# Patient Record
Sex: Female | Born: 1950 | Race: White | Hispanic: No | Marital: Married | Smoking: Never smoker
Health system: Southern US, Community
[De-identification: ages and names within clinical notes are randomized; demographics above are authoritative.]

## PROBLEM LIST (undated history)

## (undated) DIAGNOSIS — K859 Acute pancreatitis without necrosis or infection, unspecified: Secondary | ICD-10-CM

## (undated) DIAGNOSIS — E119 Type 2 diabetes mellitus without complications: Secondary | ICD-10-CM

## (undated) DIAGNOSIS — L570 Actinic keratosis: Secondary | ICD-10-CM

## (undated) DIAGNOSIS — F419 Anxiety disorder, unspecified: Secondary | ICD-10-CM

## (undated) DIAGNOSIS — C4490 Unspecified malignant neoplasm of skin, unspecified: Secondary | ICD-10-CM

## (undated) DIAGNOSIS — R609 Edema, unspecified: Secondary | ICD-10-CM

## (undated) DIAGNOSIS — I10 Essential (primary) hypertension: Secondary | ICD-10-CM

## (undated) DIAGNOSIS — E785 Hyperlipidemia, unspecified: Secondary | ICD-10-CM

## (undated) DIAGNOSIS — L409 Psoriasis, unspecified: Secondary | ICD-10-CM

## (undated) HISTORY — DX: Edema, unspecified: R60.9

## (undated) HISTORY — DX: Psoriasis, unspecified: L40.9

## (undated) HISTORY — DX: Type 2 diabetes mellitus without complications: E11.9

## (undated) HISTORY — DX: Anxiety disorder, unspecified: F41.9

## (undated) HISTORY — DX: Acute pancreatitis without necrosis or infection, unspecified: K85.90

## (undated) HISTORY — PX: BREAST BIOPSY: SHX20

## (undated) HISTORY — DX: Unspecified malignant neoplasm of skin, unspecified: C44.90

## (undated) HISTORY — DX: Actinic keratosis: L57.0

## (undated) HISTORY — DX: Hyperlipidemia, unspecified: E78.5

## (undated) HISTORY — DX: Essential (primary) hypertension: I10

---

## 1988-03-29 HISTORY — PX: BREAST SURGERY: SHX581

## 2003-03-30 DIAGNOSIS — K859 Acute pancreatitis without necrosis or infection, unspecified: Secondary | ICD-10-CM

## 2003-03-30 HISTORY — PX: CHOLECYSTECTOMY: SHX55

## 2003-03-30 HISTORY — DX: Acute pancreatitis without necrosis or infection, unspecified: K85.90

## 2003-03-30 HISTORY — PX: VAGINAL HYSTERECTOMY: SUR661

## 2004-04-01 ENCOUNTER — Ambulatory Visit: Payer: Self-pay | Admitting: Nephrology

## 2004-05-04 ENCOUNTER — Ambulatory Visit: Payer: Self-pay | Admitting: General Surgery

## 2004-10-30 ENCOUNTER — Ambulatory Visit: Payer: Self-pay | Admitting: General Surgery

## 2005-03-29 HISTORY — PX: COLONOSCOPY: SHX174

## 2005-11-08 ENCOUNTER — Ambulatory Visit: Payer: Self-pay | Admitting: Unknown Physician Specialty

## 2005-11-11 ENCOUNTER — Ambulatory Visit: Payer: Self-pay | Admitting: General Surgery

## 2006-12-14 ENCOUNTER — Ambulatory Visit: Payer: Self-pay | Admitting: Unknown Physician Specialty

## 2007-09-06 DIAGNOSIS — D239 Other benign neoplasm of skin, unspecified: Secondary | ICD-10-CM

## 2007-09-06 HISTORY — DX: Other benign neoplasm of skin, unspecified: D23.9

## 2007-12-19 ENCOUNTER — Ambulatory Visit: Payer: Self-pay | Admitting: Unknown Physician Specialty

## 2008-12-19 ENCOUNTER — Ambulatory Visit: Payer: Self-pay | Admitting: Unknown Physician Specialty

## 2009-08-02 ENCOUNTER — Emergency Department: Payer: Self-pay | Admitting: Internal Medicine

## 2010-01-14 ENCOUNTER — Ambulatory Visit: Payer: Self-pay | Admitting: Unknown Physician Specialty

## 2011-01-27 ENCOUNTER — Ambulatory Visit: Payer: Self-pay | Admitting: Unknown Physician Specialty

## 2011-06-20 ENCOUNTER — Emergency Department: Payer: Self-pay | Admitting: Unknown Physician Specialty

## 2011-06-20 LAB — URINALYSIS, COMPLETE
Bacteria: NONE SEEN
Glucose,UR: 50 mg/dL (ref 0–75)
Hyaline Cast: 3
Ketone: NEGATIVE
Leukocyte Esterase: NEGATIVE
Ph: 6 (ref 4.5–8.0)
Protein: NEGATIVE
RBC,UR: NONE SEEN /HPF (ref 0–5)
Specific Gravity: 1.006 (ref 1.003–1.030)
Squamous Epithelial: 1
WBC UR: <1 /HPF (ref 0–5)

## 2011-06-20 LAB — CBC
HCT: 43.4 % (ref 35.0–47.0)
MCH: 31.3 pg (ref 26.0–34.0)
MCHC: 32.6 g/dL (ref 32.0–36.0)
MCV: 96 fL (ref 80–100)
Platelet: 284 10*3/uL (ref 150–440)
RBC: 4.52 10*6/uL (ref 3.80–5.20)
RDW: 12.8 % (ref 11.5–14.5)
WBC: 6.6 10*3/uL (ref 3.6–11.0)

## 2011-06-20 LAB — BASIC METABOLIC PANEL
Anion Gap: 14 (ref 7–16)
Calcium, Total: 9 mg/dL (ref 8.5–10.1)
EGFR (African American): 53 — ABNORMAL LOW
EGFR (Non-African Amer.): 44 — ABNORMAL LOW
Glucose: 209 mg/dL — ABNORMAL HIGH (ref 65–99)
Sodium: 140 mmol/L (ref 136–145)

## 2011-06-20 LAB — HEPATIC FUNCTION PANEL A (ARMC)
Alkaline Phosphatase: 93 U/L (ref 50–136)
Bilirubin, Direct: 0.2 mg/dL (ref 0.00–0.20)
Bilirubin,Total: 0.7 mg/dL (ref 0.2–1.0)
SGPT (ALT): 21 U/L
Total Protein: 8.3 g/dL — ABNORMAL HIGH (ref 6.4–8.2)

## 2011-06-20 LAB — TROPONIN I: Troponin-I: <0.02 ng/mL

## 2012-10-16 DIAGNOSIS — C4491 Basal cell carcinoma of skin, unspecified: Secondary | ICD-10-CM

## 2012-10-16 HISTORY — DX: Basal cell carcinoma of skin, unspecified: C44.91

## 2013-01-24 ENCOUNTER — Encounter: Payer: Self-pay | Admitting: Podiatry

## 2013-01-24 ENCOUNTER — Ambulatory Visit (INDEPENDENT_AMBULATORY_CARE_PROVIDER_SITE_OTHER): Payer: BC Managed Care – PPO | Admitting: Podiatry

## 2013-01-24 VITALS — BP 131/70 | HR 81 | Resp 16 | Ht 62.0 in | Wt 170.0 lb

## 2013-01-24 DIAGNOSIS — M722 Plantar fascial fibromatosis: Secondary | ICD-10-CM

## 2013-01-24 NOTE — Patient Instructions (Signed)
ICE INSTRUCTIONS  Apply ice or cold pack to the affected area at least 3 times a day for 10-15 minutes each time.  You should also use ice after prolonged activity or vigorous exercise.  Do not apply ice longer than 20 minutes at one time.  Always keep a cloth between your skin and the ice pack to prevent burns.  Being consistent and following these instructions will help control your symptoms.  We suggest you purchase a gel ice pack because they are reusable and do bit leak.  Some of them are designed to wrap around the area.  Use the method that works best for you.  Here are some other suggestions for icing.   Use a frozen bag of peas or corn-inexpensive and molds well to your body, usually stays frozen for 10 to 20 minutes.  Wet a towel with cold water and squeeze out the excess until it's damp.  Place in a bag in the freezer for 20 minutes. Then remove and use      Plantar Fasciitis Plantar fasciitis is a common condition that causes foot pain. It is soreness (inflammation) of the band of tough fibrous tissue on the bottom of the foot that runs from the heel bone (calcaneus) to the ball of the foot. The cause of this soreness may be from excessive standing, poor fitting shoes, running on hard surfaces, being overweight, having an abnormal walk, or overuse (this is common in runners) of the painful foot or feet. It is also common in aerobic exercise dancers and ballet dancers. SYMPTOMS  Most people with plantar fasciitis complain of:  Severe pain in the morning on the bottom of their foot especially when taking the first steps out of bed. This pain recedes after a few minutes of walking.  Severe pain is experienced also during walking following a long period of inactivity.  Pain is worse when walking barefoot or up stairs DIAGNOSIS   Your caregiver will diagnose this condition by examining and feeling your foot.  Special tests such as X-rays of your foot, are usually not  needed. PREVENTION   Consult a sports medicine professional before beginning a new exercise program.  Walking programs offer a good workout. With walking there is a lower chance of overuse injuries common to runners. There is less impact and less jarring of the joints.  Begin all new exercise programs slowly. If problems or pain develop, decrease the amount of time or distance until you are at a comfortable level.  Wear good shoes and replace them regularly.  Stretch your foot and the heel cords at the back of the ankle (Achilles tendon) both before and after exercise.  Run or exercise on even surfaces that are not hard. For example, asphalt is better than pavement.  Do not run barefoot on hard surfaces.  If using a treadmill, vary the incline.  Do not continue to workout if you have foot or joint problems. Seek professional help if they do not improve. HOME CARE INSTRUCTIONS   Avoid activities that cause you pain until you recover.  Use ice or cold packs on the problem or painful areas after working out.  Only take over-the-counter or prescription medicines for pain, discomfort, or fever as directed by your caregiver.  Soft shoe inserts or athletic shoes with air or gel sole cushions may be helpful.  If problems continue or become more severe, consult a sports medicine caregiver or your own health care provider. Cortisone is a potent anti-inflammatory medication  that may be injected into the painful area. You can discuss this treatment with your caregiver. MAKE SURE YOU:   Understand these instructions.  Will watch your condition.  Will get help right away if you are not doing well or get worse. Document Released: 12/08/2000 Document Revised: 06/07/2011 Document Reviewed: 02/07/2008 [employer] Patient Information 2014 [employer], [place]     Plantar Fasciitis (Heel Spur Syndrome) with Rehab The plantar fascia is a fibrous, ligament-like, soft-tissue structure that spans the  bottom of the foot. Plantar fasciitis is a condition that causes pain in the foot due to inflammation of the tissue. SYMPTOMS   Pain and tenderness on the underneath side of the foot.  Pain that worsens with standing or walking. CAUSES  Plantar fasciitis is caused by irritation and injury to the plantar fascia on the underneath side of the foot. Common mechanisms of injury include:  Direct trauma to bottom of the foot.  Damage to a small nerve that runs under the foot where the main fascia attaches to the heel bone.  Stress placed on the plantar fascia due to bone spurs. RISK INCREASES WITH:   Activities that place stress on the plantar fascia (running, jumping, pivoting, or cutting).  Poor strength and flexibility.  Improperly fitted shoes.  Tight calf muscles.  Flat feet.  Failure to warm-up properly before activity.  Obesity. PREVENTION  Warm up and stretch properly before activity.  Allow for adequate recovery between workouts.  Maintain physical fitness:  Strength, flexibility, and endurance.  Cardiovascular fitness.  Maintain a health body weight.  Avoid stress on the plantar fascia.  Wear properly fitted shoes, including arch supports for individuals who have flat feet. PROGNOSIS  If treated properly, then the symptoms of plantar fasciitis usually resolve without surgery. However, occasionally surgery is necessary. RELATED COMPLICATIONS   Recurrent symptoms that may result in a chronic condition.  Problems of the lower back that are caused by compensating for the injury, such as limping.  Pain or weakness of the foot during push-off following surgery.  Chronic inflammation, scarring, and partial or complete fascia tear, occurring more often from repeated injections. TREATMENT  Treatment initially involves the use of ice and medication to help reduce pain and inflammation. The use of strengthening and stretching exercises may help reduce pain with  activity, especially stretches of the Achilles tendon. These exercises may be performed at home or with a therapist. Your caregiver may recommend that you use heel cups of arch supports to help reduce stress on the plantar fascia. Occasionally, corticosteroid injections are given to reduce inflammation. If symptoms persist for greater than 6 months despite non-surgical (conservative), then surgery may be recommended.  MEDICATION   If pain medication is necessary, then nonsteroidal anti-inflammatory medications, such as aspirin and ibuprofen, or other minor pain relievers, such as acetaminophen, are often recommended.  Do not take pain medication within 7 days before surgery.  Prescription pain relievers may be given if deemed necessary by your caregiver. Use only as directed and only as much as you need.  Corticosteroid injections may be given by your caregiver. These injections should be reserved for the most serious cases, because they may only be given a certain number of times. HEAT AND COLD  Cold treatment (icing) relieves pain and reduces inflammation. Cold treatment should be applied for 10 to 15 minutes every 2 to 3 hours for inflammation and pain and immediately after any activity that aggravates your symptoms. Use ice packs or massage the area with a  piece of ice (ice massage).  Heat treatment may be used prior to performing the stretching and strengthening activities prescribed by your caregiver, physical therapist, or athletic trainer. Use a heat pack or soak the injury in warm water. SEEK IMMEDIATE MEDICAL CARE IF:  Treatment seems to offer no benefit, or the condition worsens.  Any medications produce adverse side effects. EXERCISES RANGE OF MOTION (ROM) AND STRETCHING EXERCISES - Plantar Fasciitis (Heel Spur Syndrome) These exercises may help you when beginning to rehabilitate your injury. Your symptoms may resolve with or without further involvement from your physician, physical  therapist or athletic trainer. While completing these exercises, remember:   Restoring tissue flexibility helps normal motion to return to the joints. This allows healthier, less painful movement and activity.  An effective stretch should be held for at least 30 seconds.  A stretch should never be painful. You should only feel a gentle lengthening or release in the stretched tissue. RANGE OF MOTION - Toe Extension, Flexion  Sit with your right / left leg crossed over your opposite knee.  Grasp your toes and gently pull them back toward the top of your foot. You should feel a stretch on the bottom of your toes and/or foot.  Hold this stretch for __________ seconds.  Now, gently pull your toes toward the bottom of your foot. You should feel a stretch on the top of your toes and or foot.  Hold this stretch for __________ seconds. Repeat __________ times. Complete this stretch __________ times per day.  RANGE OF MOTION - Ankle Dorsiflexion, Active Assisted  Remove shoes and sit on a chair that is preferably not on a carpeted surface.  Place right / left foot under knee. Extend your opposite leg for support.  Keeping your heel down, slide your right / left foot back toward the chair until you feel a stretch at your ankle or calf. If you do not feel a stretch, slide your bottom forward to the edge of the chair, while still keeping your heel down.  Hold this stretch for __________ seconds. Repeat __________ times. Complete this stretch __________ times per day.  STRETCH  Gastroc, Standing  Place hands on wall.  Extend right / left leg, keeping the front knee somewhat bent.  Slightly point your toes inward on your back foot.  Keeping your right / left heel on the floor and your knee straight, shift your weight toward the wall, not allowing your back to arch.  You should feel a gentle stretch in the right / left calf. Hold this position for __________ seconds. Repeat __________ times.  Complete this stretch __________ times per day. STRETCH  Soleus, Standing  Place hands on wall.  Extend right / left leg, keeping the other knee somewhat bent.  Slightly point your toes inward on your back foot.  Keep your right / left heel on the floor, bend your back knee, and slightly shift your weight over the back leg so that you feel a gentle stretch deep in your back calf.  Hold this position for __________ seconds. Repeat __________ times. Complete this stretch __________ times per day. STRETCH  Gastrocsoleus, Standing  Note: This exercise can place a lot of stress on your foot and ankle. Please complete this exercise only if specifically instructed by your caregiver.   Place the ball of your right / left foot on a step, keeping your other foot firmly on the same step.  Hold on to the wall or a rail for balance.  Slowly lift your other foot, allowing your body weight to press your heel down over the edge of the step.  You should feel a stretch in your right / left calf.  Hold this position for __________ seconds.  Repeat this exercise with a slight bend in your right / left knee. Repeat __________ times. Complete this stretch __________ times per day.  STRENGTHENING EXERCISES - Plantar Fasciitis (Heel Spur Syndrome)  These exercises may help you when beginning to rehabilitate your injury. They may resolve your symptoms with or without further involvement from your physician, physical therapist or athletic trainer. While completing these exercises, remember:   Muscles can gain both the endurance and the strength needed for everyday activities through controlled exercises.  Complete these exercises as instructed by your physician, physical therapist or athletic trainer. Progress the resistance and repetitions only as guided. STRENGTH - Towel Curls  Sit in a chair positioned on a non-carpeted surface.  Place your foot on a towel, keeping your heel on the floor.  Pull the  towel toward your heel by only curling your toes. Keep your heel on the floor.  If instructed by your physician, physical therapist or athletic trainer, add ____________________ at the end of the towel. Repeat __________ times. Complete this exercise __________ times per day. STRENGTH - Ankle Inversion  Secure one end of a rubber exercise band/tubing to a fixed object (table, pole). Loop the other end around your foot just before your toes.  Place your fists between your knees. This will focus your strengthening at your ankle.  Slowly, pull your big toe up and in, making sure the band/tubing is positioned to resist the entire motion.  Hold this position for __________ seconds.  Have your muscles resist the band/tubing as it slowly pulls your foot back to the starting position. Repeat __________ times. Complete this exercises __________ times per day.  Document Released: 03/15/2005 Document Revised: 06/07/2011 Document Reviewed: 06/27/2008 [facility] Patient Information 2014 [place], [place].

## 2013-01-24 NOTE — Progress Notes (Signed)
Valerie Farmer presents today for followup of her plantar fasciitis right states that he was doing okay until a went to the beach in a walk too much.  Objective: Vital signs are stable she is alert and oriented x3. Pulses remain palpable right lower extremity. Pain on palpation medial calcaneal tubercle right foot.  Assessment: Plantar fasciitis right.  Plan: Reinjected right heel today continue all conservative therapies followup with her in one month.

## 2013-02-26 ENCOUNTER — Encounter: Payer: Self-pay | Admitting: Podiatry

## 2013-02-26 ENCOUNTER — Ambulatory Visit (INDEPENDENT_AMBULATORY_CARE_PROVIDER_SITE_OTHER): Payer: BC Managed Care – PPO | Admitting: Podiatry

## 2013-02-26 VITALS — BP 144/74 | HR 70 | Resp 20 | Ht 62.0 in | Wt 170.0 lb

## 2013-02-26 DIAGNOSIS — M722 Plantar fascial fibromatosis: Secondary | ICD-10-CM

## 2013-02-26 NOTE — Patient Instructions (Signed)
Plantar Fasciitis (Heel Spur Syndrome) with Rehab The plantar fascia is a fibrous, ligament-like, soft-tissue structure that spans the bottom of the foot. Plantar fasciitis is a condition that causes pain in the foot due to inflammation of the tissue. SYMPTOMS   Pain and tenderness on the underneath side of the foot.  Pain that worsens with standing or walking. CAUSES  Plantar fasciitis is caused by irritation and injury to the plantar fascia on the underneath side of the foot. Common mechanisms of injury include:  Direct trauma to bottom of the foot.  Damage to a small nerve that runs under the foot where the main fascia attaches to the heel bone.  Stress placed on the plantar fascia due to bone spurs. RISK INCREASES WITH:   Activities that place stress on the plantar fascia (running, jumping, pivoting, or cutting).  Poor strength and flexibility.  Improperly fitted shoes.  Tight calf muscles.  Flat feet.  Failure to warm-up properly before activity.  Obesity. PREVENTION  Warm up and stretch properly before activity.  Allow for adequate recovery between workouts.  Maintain physical fitness:  Strength, flexibility, and endurance.  Cardiovascular fitness.  Maintain a health body weight.  Avoid stress on the plantar fascia.  Wear properly fitted shoes, including arch supports for individuals who have flat feet. PROGNOSIS  If treated properly, then the symptoms of plantar fasciitis usually resolve without surgery. However, occasionally surgery is necessary. RELATED COMPLICATIONS   Recurrent symptoms that may result in a chronic condition.  Problems of the lower back that are caused by compensating for the injury, such as limping.  Pain or weakness of the foot during push-off following surgery.  Chronic inflammation, scarring, and partial or complete fascia tear, occurring more often from repeated injections. TREATMENT  Treatment initially involves the use of  ice and medication to help reduce pain and inflammation. The use of strengthening and stretching exercises may help reduce pain with activity, especially stretches of the Achilles tendon. These exercises may be performed at home or with a therapist. Your caregiver may recommend that you use heel cups of arch supports to help reduce stress on the plantar fascia. Occasionally, corticosteroid injections are given to reduce inflammation. If symptoms persist for greater than 6 months despite non-surgical (conservative), then surgery may be recommended.  MEDICATION   If pain medication is necessary, then nonsteroidal anti-inflammatory medications, such as aspirin and ibuprofen, or other minor pain relievers, such as acetaminophen, are often recommended.  Do not take pain medication within 7 days before surgery.  Prescription pain relievers may be given if deemed necessary by your caregiver. Use only as directed and only as much as you need.  Corticosteroid injections may be given by your caregiver. These injections should be reserved for the most serious cases, because they may only be given a certain number of times. HEAT AND COLD  Cold treatment (icing) relieves pain and reduces inflammation. Cold treatment should be applied for 10 to 15 minutes every 2 to 3 hours for inflammation and pain and immediately after any activity that aggravates your symptoms. Use ice packs or massage the area with a piece of ice (ice massage).  Heat treatment may be used prior to performing the stretching and strengthening activities prescribed by your caregiver, physical therapist, or athletic trainer. Use a heat pack or soak the injury in warm water. SEEK IMMEDIATE MEDICAL CARE IF:  Treatment seems to offer no benefit, or the condition worsens.  Any medications produce adverse side effects. EXERCISES RANGE   OF MOTION (ROM) AND STRETCHING EXERCISES - Plantar Fasciitis (Heel Spur Syndrome) These exercises may help you  when beginning to rehabilitate your injury. Your symptoms may resolve with or without further involvement from your physician, physical therapist or athletic trainer. While completing these exercises, remember:   Restoring tissue flexibility helps normal motion to return to the joints. This allows healthier, less painful movement and activity.  An effective stretch should be held for at least 30 seconds.  A stretch should never be painful. You should only feel a gentle lengthening or release in the stretched tissue. RANGE OF MOTION - Toe Extension, Flexion  Sit with your right / left leg crossed over your opposite knee.  Grasp your toes and gently pull them back toward the top of your foot. You should feel a stretch on the bottom of your toes and/or foot.  Hold this stretch for __________ seconds.  Now, gently pull your toes toward the bottom of your foot. You should feel a stretch on the top of your toes and or foot.  Hold this stretch for __________ seconds. Repeat __________ times. Complete this stretch __________ times per day.  RANGE OF MOTION - Ankle Dorsiflexion, Active Assisted  Remove shoes and sit on a chair that is preferably not on a carpeted surface.  Place right / left foot under knee. Extend your opposite leg for support.  Keeping your heel down, slide your right / left foot back toward the chair until you feel a stretch at your ankle or calf. If you do not feel a stretch, slide your bottom forward to the edge of the chair, while still keeping your heel down.  Hold this stretch for __________ seconds. Repeat __________ times. Complete this stretch __________ times per day.  STRETCH  Gastroc, Standing  Place hands on wall.  Extend right / left leg, keeping the front knee somewhat bent.  Slightly point your toes inward on your back foot.  Keeping your right / left heel on the floor and your knee straight, shift your weight toward the wall, not allowing your back to  arch.  You should feel a gentle stretch in the right / left calf. Hold this position for __________ seconds. Repeat __________ times. Complete this stretch __________ times per day. STRETCH  Soleus, Standing  Place hands on wall.  Extend right / left leg, keeping the other knee somewhat bent.  Slightly point your toes inward on your back foot.  Keep your right / left heel on the floor, bend your back knee, and slightly shift your weight over the back leg so that you feel a gentle stretch deep in your back calf.  Hold this position for __________ seconds. Repeat __________ times. Complete this stretch __________ times per day. STRETCH  Gastrocsoleus, Standing  Note: This exercise can place a lot of stress on your foot and ankle. Please complete this exercise only if specifically instructed by your caregiver.   Place the ball of your right / left foot on a step, keeping your other foot firmly on the same step.  Hold on to the wall or a rail for balance.  Slowly lift your other foot, allowing your body weight to press your heel down over the edge of the step.  You should feel a stretch in your right / left calf.  Hold this position for __________ seconds.  Repeat this exercise with a slight bend in your right / left knee. Repeat __________ times. Complete this stretch __________ times per day.    STRENGTHENING EXERCISES - Plantar Fasciitis (Heel Spur Syndrome)  These exercises may help you when beginning to rehabilitate your injury. They may resolve your symptoms with or without further involvement from your physician, physical therapist or athletic trainer. While completing these exercises, remember:   Muscles can gain both the endurance and the strength needed for everyday activities through controlled exercises.  Complete these exercises as instructed by your physician, physical therapist or athletic trainer. Progress the resistance and repetitions only as guided. STRENGTH - Towel  Curls  Sit in a chair positioned on a non-carpeted surface.  Place your foot on a towel, keeping your heel on the floor.  Pull the towel toward your heel by only curling your toes. Keep your heel on the floor.  If instructed by your physician, physical therapist or athletic trainer, add ____________________ at the end of the towel. Repeat __________ times. Complete this exercise __________ times per day. STRENGTH - Ankle Inversion  Secure one end of a rubber exercise band/tubing to a fixed object (table, pole). Loop the other end around your foot just before your toes.  Place your fists between your knees. This will focus your strengthening at your ankle.  Slowly, pull your big toe up and in, making sure the band/tubing is positioned to resist the entire motion.  Hold this position for __________ seconds.  Have your muscles resist the band/tubing as it slowly pulls your foot back to the starting position. Repeat __________ times. Complete this exercises __________ times per day.  Document Released: 03/15/2005 Document Revised: 06/07/2011 Document Reviewed: 06/27/2008 [employer] Patient Information 2014 [employer]. Plantar Fasciitis Plantar fasciitis is a common condition that causes foot pain. It is soreness (inflammation) of the band of tough fibrous tissue on the bottom of the foot that runs from the heel bone (calcaneus) to the ball of the foot. The cause of this soreness may be from excessive standing, poor fitting shoes, running on hard surfaces, being overweight, having an abnormal walk, or overuse (this is common in runners) of the painful foot or feet. It is also common in aerobic exercise dancers and ballet dancers. SYMPTOMS  Most people with plantar fasciitis complain of:  Severe pain in the morning on the bottom of their foot especially when taking the first steps out of bed. This pain recedes after a few minutes of walking.  Severe pain is experienced also during walking  following a long period of inactivity.  Pain is worse when walking barefoot or up stairs DIAGNOSIS   Your caregiver will diagnose this condition by examining and feeling your foot.  Special tests such as X-rays of your foot, are usually not needed. PREVENTION   Consult a sports medicine professional before beginning a new exercise program.  Walking programs offer a good workout. With walking there is a lower chance of overuse injuries common to runners. There is less impact and less jarring of the joints.  Begin all new exercise programs slowly. If problems or pain develop, decrease the amount of time or distance until you are at a comfortable level.  Wear good shoes and replace them regularly.  Stretch your foot and the heel cords at the back of the ankle (Achilles tendon) both before and after exercise.  Run or exercise on even surfaces that are not hard. For example, asphalt is better than pavement.  Do not run barefoot on hard surfaces.  If using a treadmill, vary the incline.  Do not continue to workout if you have foot or joint   problems. Seek professional help if they do not improve. HOME CARE INSTRUCTIONS   Avoid activities that cause you pain until you recover.  Use ice or cold packs on the problem or painful areas after working out.  Only take over-the-counter or prescription medicines for pain, discomfort, or fever as directed by your caregiver.  Soft shoe inserts or athletic shoes with air or gel sole cushions may be helpful.  If problems continue or become more severe, consult a sports medicine caregiver or your own health care provider. Cortisone is a potent anti-inflammatory medication that may be injected into the painful area. You can discuss this treatment with your caregiver. MAKE SURE YOU:   Understand these instructions.  Will watch your condition.  Will get help right away if you are not doing well or get worse. Document Released: 12/08/2000 Document  Revised: 06/07/2011 Document Reviewed: 02/07/2008 [employer] Patient Information 2014 [employer].  

## 2013-02-26 NOTE — Progress Notes (Signed)
She presents today for followup of her plantar fasciitis to her right foot. She states that it's 75-80% better.  Objective: Pulses remain palpable right foot. She has pain on palpation medial continued tubercle right foot.  Assessment: Plantar fasciitis right  Plan: Reinjected right heel today apply plantar fascial strapping we'll followup with her one month. We'll consider orthotics at that time.

## 2013-03-26 ENCOUNTER — Ambulatory Visit: Payer: BC Managed Care – PPO | Admitting: Podiatry

## 2013-03-29 HISTORY — PX: BREAST CYST ASPIRATION: SHX578

## 2013-08-23 ENCOUNTER — Encounter: Payer: Self-pay | Admitting: *Deleted

## 2013-09-03 ENCOUNTER — Encounter: Payer: Self-pay | Admitting: General Surgery

## 2013-09-05 ENCOUNTER — Ambulatory Visit: Payer: BC Managed Care – PPO | Admitting: General Surgery

## 2013-09-05 ENCOUNTER — Encounter: Payer: Self-pay | Admitting: General Surgery

## 2013-09-05 ENCOUNTER — Ambulatory Visit (INDEPENDENT_AMBULATORY_CARE_PROVIDER_SITE_OTHER): Payer: Medicare Other | Admitting: General Surgery

## 2013-09-05 ENCOUNTER — Other Ambulatory Visit: Payer: Medicare Other

## 2013-09-05 ENCOUNTER — Other Ambulatory Visit: Payer: Self-pay

## 2013-09-05 VITALS — BP 122/82 | HR 80 | Resp 16 | Ht 62.0 in | Wt 175.0 lb

## 2013-09-05 DIAGNOSIS — N63 Unspecified lump in unspecified breast: Secondary | ICD-10-CM

## 2013-09-05 HISTORY — PX: BREAST SURGERY: SHX581

## 2013-09-05 NOTE — Progress Notes (Addendum)
Patient ID: Valerie Farmer, female   DOB: [birth year], 63 y.o.   MRN: [id number]  Chief Complaint  Patient presents with  . Other    left breast pain    HPI Valerie Farmer is a 63 y.o. female who presents for a breast evaluation. The most recent mammogram was done on 04/13/2013. Patient states she felt a left breast knot below her nipple has  been for about 2 weeks ago .She states the knot has got smaller but she still is tender there. Patient does not perform regular self breast checks but does gets regular mammograms done.    The patient has significantly improved since the onset and the redness which was described as being in a "V" as stated.  There was no history of trauma to the area.  HPI  Past Medical History  Diagnosis Date  . Swelling   . Diabetes   . HBP (high blood pressure)   . Skin cancer   . Pancreatitis 2005    Past Surgical History  Procedure Laterality Date  . Gallbladder surgery    . Cholecystectomy  2005  . Vaginal hysterectomy  2005    Family History  Problem Relation Age of Onset  . Bladder Cancer Father   . Breast cancer Paternal Aunt     Social History History  Substance Use Topics  . Smoking status: Never Smoker   . Smokeless tobacco: Never Used  . Alcohol Use: No    No Known Allergies  Current Outpatient Prescriptions  Medication Sig Dispense Refill  . ALPRAZolam (XANAX) 0.25 MG tablet       . aspirin 81 MG tablet Take 81 mg by mouth daily.      Marland Kitchen atorvastatin (LIPITOR) 20 MG tablet       . Calcium Carb-Cholecalciferol (CALCIUM 600 + D PO) Take by mouth daily.      . Cholecalciferol (VITAMIN D-3 PO) Take by mouth daily.      Marland Kitchen losartan (COZAAR) 50 MG tablet       . losartan-hydrochlorothiazide (HYZAAR) 50-12.5 MG per tablet       . metformin (FORTAMET) 1000 MG (OSM) 24 hr tablet       . NASONEX 50 MCG/ACT nasal spray       . ONE TOUCH ULTRA TEST test strip       . Probiotic Product (ALIGN PO) Take by mouth daily.       No current  facility-administered medications for this visit.    Review of Systems Review of Systems  Constitutional: Negative.   Respiratory: Negative.   Cardiovascular: Negative.     Blood pressure 122/82, pulse 80, resp. rate 16, height 5\' 2"  (1.575 m), weight 175 lb (79.379 kg).  Physical Exam Physical Exam  Constitutional: She is oriented to person, place, and time. She appears well-developed and well-nourished.  Eyes: Conjunctivae are normal. No scleral icterus.  Neck: Neck supple.  Cardiovascular: Normal rate, regular rhythm and normal heart sounds.   Pulmonary/Chest: Breath sounds normal. Right breast exhibits no inverted nipple, no mass, no nipple discharge, no skin change and no tenderness. Left breast exhibits no inverted nipple, no mass, no nipple discharge, no skin change and no tenderness. Breasts are asymmetrical (left breast bigger than right breast).    Abdominal: Soft. Bowel sounds are normal. There is no tenderness.  Neurological: She is alert and oriented to person, place, and time.  Skin: Skin is warm and dry.    Data Reviewed Bilateral mammograms dated 04/13/2013 were reported as  BI-RAD-2, scattered fibroglandular density.  The supplied the disc could not be read.  Ultrasound examination of the left breast was completed with special attention to the area of palpable thickening. A 0.7 x 1.4 x 2.17 cm hypoechoic mass with faint enhancement and scattered areas of shadowing beginning below the base of the nipple and extending to the edge of the areola. There was a patent vessel running below the index lesion towards the nipple measuring perhaps 2.5 mm in diameter.  The appearance is that of a traumatic lesion, possibly now with residual hematoma. The patient was amenable to FNA sampling. 1 cc of 1% plain Xylocaine was utilized and multiple passes through the area were completed with a small return of fluid. Slides were prepared for cytology. The procedure was well  tolerated.  The 04/13/2013 screening mammograms completed at [place] were reviewed. No change in the long-standing density in the upper-outer quadrant right breast. The left breast is essentially fatty replaced. Fatty-replaced lymph nodes in the left axilla. No retroareolar abnormality on the left. No skin thickening.    Assessment    Left breast mass with changes suggesting vascular injury.    Plan    The patient will be contacted when cytology is available. If entirely benign she's been asked to return for a six-week checked to be sure this area completely resolves. She has reluctantly agreed.    PCP: Marcello Fennel  Ref. Dr. Percell Boston, [place] Gleason 09/07/2013, 2:23 PM

## 2013-09-05 NOTE — Patient Instructions (Addendum)
Patient to return in six weeks.  

## 2013-09-07 DIAGNOSIS — N63 Unspecified lump in unspecified breast: Secondary | ICD-10-CM | POA: Insufficient documentation

## 2013-09-11 ENCOUNTER — Telehealth: Payer: Self-pay | Admitting: General Surgery

## 2013-09-11 LAB — FINE-NEEDLE ASPIRATION

## 2013-09-11 NOTE — Telephone Encounter (Signed)
Cytology showed evidence of acute inflammation and blood. This corresponds with the clinical history provided by the patient. She was notified of the results. We'll plan on a followup exam in 6 weeks as originally discussed.

## 2013-10-08 ENCOUNTER — Ambulatory Visit (INDEPENDENT_AMBULATORY_CARE_PROVIDER_SITE_OTHER): Payer: Medicare Other | Admitting: General Surgery

## 2013-10-08 ENCOUNTER — Encounter: Payer: Self-pay | Admitting: General Surgery

## 2013-10-08 VITALS — BP 132/64 | HR 72 | Resp 12 | Ht 62.0 in | Wt 174.0 lb

## 2013-10-08 DIAGNOSIS — N63 Unspecified lump in unspecified breast: Secondary | ICD-10-CM

## 2013-10-08 NOTE — Patient Instructions (Signed)
Colonoscopy 2017.

## 2013-10-08 NOTE — Progress Notes (Signed)
Patient ID: Valerie Farmer, female   DOB: 11-11-1950, 63 y.o.   MRN: [id number]  Chief Complaint  Patient presents with  . Follow-up    left breast mass    HPI Valerie Farmer is a 63 y.o. female here today for her six weeks left breast mass aspirated her last visit. Patient states she does not feel the area any more.     HPI  Past Medical History  Diagnosis Date  . Swelling   . Diabetes   . HBP (high blood pressure)   . Skin cancer   . Pancreatitis 2005    Past Surgical History  Procedure Laterality Date  . Gallbladder surgery    . Cholecystectomy  2005  . Vaginal hysterectomy  2005  . Colonoscopy  2007    DR. Keyion Knack    Family History  Problem Relation Age of Onset  . Bladder Cancer Father   . Breast cancer Paternal Aunt     Social History History  Substance Use Topics  . Smoking status: Never Smoker   . Smokeless tobacco: Never Used  . Alcohol Use: No    No Known Allergies  Current Outpatient Prescriptions  Medication Sig Dispense Refill  . ALPRAZolam (XANAX) 0.25 MG tablet Take 0.25 mg by mouth at bedtime as needed.       Marland Kitchen aspirin 81 MG tablet Take 81 mg by mouth daily.      Marland Kitchen atorvastatin (LIPITOR) 20 MG tablet Take 20 mg by mouth daily at 6 PM.       . Calcium Carb-Cholecalciferol (CALCIUM 600 + D PO) Take by mouth daily.      . Cholecalciferol (VITAMIN D-3 PO) Take by mouth daily.      Marland Kitchen losartan (COZAAR) 50 MG tablet Take 50 mg by mouth daily.       Marland Kitchen losartan-hydrochlorothiazide (HYZAAR) 50-12.5 MG per tablet Take 1 tablet by mouth daily.       . metformin (FORTAMET) 1000 MG (OSM) 24 hr tablet Take 1,000 mg by mouth daily with breakfast.       . NASONEX 50 MCG/ACT nasal spray Place 2 sprays into the nose daily.       . ONE TOUCH ULTRA TEST test strip       . Probiotic Product (ALIGN PO) Take 1 tablet by mouth daily.        No current facility-administered medications for this visit.    Review of Systems Review of Systems  Constitutional:  Negative.   Respiratory: Negative.   Cardiovascular: Negative.   Gastrointestinal:       The patient reports she is noticed increasing amounts of flatus and belching. No change in bowel habits. No blood or mucus in her stools.    Blood pressure 132/64, pulse 72, resp. rate 12, height 5\' 2"  (1.575 m), weight 174 lb (78.926 kg).  Physical Exam Physical Exam  Constitutional: She is oriented to person, place, and time. She appears well-developed and well-nourished.  Eyes: Conjunctivae are normal. No scleral icterus.  Neck: Neck supple.  Cardiovascular: Normal rate, regular rhythm and normal heart sounds.   Pulmonary/Chest: Effort normal and breath sounds normal. Left breast exhibits no inverted nipple, no mass, no nipple discharge, no skin change and no tenderness.  Complete resolution of nodular density at the edge of the areola to 5:00 position and erythematous streaking noted on her examination 6 weeks ago.  Abdominal: Soft. Bowel sounds are normal. There is no tenderness.  Lymphadenopathy:    She has  no cervical adenopathy.    She has no axillary adenopathy.  Neurological: She is alert and oriented to person, place, and time.  Skin: Skin is warm and dry.    Data Reviewed 09/05/2013 FNA: LEFT BREAST NEGATIVE FOR MALIGNANT CELLS. FOAM CELLS ARE PRESENT IN A BACKGROUND OF ACUTE INFLAMMATION AND BLOOD.   Assessment    Complete resolution of previous breast swelling and redness. Likely traumatic episode. Increased flatus, improved with the use of probiotic.    Plan    The patient was asked to call should she have recurrent symptoms. Otherwise she'll continue annual screening exams with her primary care provider/GYN. The patient remains a candidate for a colonoscopy in 2017. Unless blood or mucus is evident in the stools or she appreciates is change in bowel habits, early exam was not indicated.     PCP: Marcello Fennel Ref. Dr. Percell Boston, [place] Gleason 10/09/2013,  5:17 PM

## 2013-10-09 ENCOUNTER — Encounter: Payer: Self-pay | Admitting: General Surgery

## 2013-11-27 DIAGNOSIS — I10 Essential (primary) hypertension: Secondary | ICD-10-CM | POA: Insufficient documentation

## 2013-11-27 DIAGNOSIS — E7800 Pure hypercholesterolemia, unspecified: Secondary | ICD-10-CM | POA: Insufficient documentation

## 2013-11-27 DIAGNOSIS — E119 Type 2 diabetes mellitus without complications: Secondary | ICD-10-CM | POA: Insufficient documentation

## 2013-11-27 DIAGNOSIS — F419 Anxiety disorder, unspecified: Secondary | ICD-10-CM | POA: Insufficient documentation

## 2014-01-28 ENCOUNTER — Encounter: Payer: Self-pay | Admitting: General Surgery

## 2014-05-20 DIAGNOSIS — D039 Melanoma in situ, unspecified: Secondary | ICD-10-CM

## 2014-05-20 HISTORY — DX: Melanoma in situ, unspecified: D03.9

## 2014-07-08 DIAGNOSIS — Z86006 Personal history of melanoma in-situ: Secondary | ICD-10-CM | POA: Insufficient documentation

## 2015-02-12 ENCOUNTER — Ambulatory Visit (INDEPENDENT_AMBULATORY_CARE_PROVIDER_SITE_OTHER): Payer: Medicare Other | Admitting: Podiatry

## 2015-02-12 ENCOUNTER — Encounter: Payer: Self-pay | Admitting: Podiatry

## 2015-02-12 VITALS — BP 121/61 | HR 86 | Resp 16

## 2015-02-12 DIAGNOSIS — Q828 Other specified congenital malformations of skin: Secondary | ICD-10-CM

## 2015-02-12 DIAGNOSIS — M79676 Pain in unspecified toe(s): Secondary | ICD-10-CM

## 2015-02-12 DIAGNOSIS — B351 Tinea unguium: Secondary | ICD-10-CM | POA: Diagnosis not present

## 2015-02-12 DIAGNOSIS — L600 Ingrowing nail: Secondary | ICD-10-CM

## 2015-02-12 MED ORDER — NEOMYCIN-POLYMYXIN-HC 1 % OT SOLN: OTIC | Status: DC

## 2015-02-12 NOTE — Progress Notes (Signed)
She is presents today with chief complaint of painful tyloma bilateral. She's also complaining of painful elongated toenails with 1 toenail in particular her second right growing into her skin. She states that this is almost impossible to trim and she has a hard time getting to it. She denies changes in her past medical history medications allergies surgeries and social history. States that she spends a lot of time taking care of both her mother and mother-in-law as they are in a nursing facility.  Objective: Vital signs are stable alert and oriented 3. Pulses are strongly palpable. Neurologic sensorium is intact. Her toenails are thick and dystrophic painful palpation. She is a thick tyloma plantar aspect of bilateral foot left greater than right but is not thick enough to trim today. She also has a sharply incurvated nail margin along the tibial border of the second digit of the right foot. This is consistent with an ingrown toenail.  Assessment: Ingrown toenail. Painful elongated toenails bilateral. Tyloma bilateral.  Plan: Discussed etiology pathology conservative versus surgical therapies. At this point I encouraged her to have the matrixectomy performed. This is performed today after local anesthesia was administered resecting the medial border of the incurvated nail plate of the second toe right foot. This was removed in total today and phenolized. She was given both oral and written home-going instructions as well as a prescription for Cortisporin Otic and will follow up with her in a couple of weeks. I also debrided her nails for her today 1 through 5 bilateral.

## 2015-02-12 NOTE — Patient Instructions (Signed)

## 2015-03-03 ENCOUNTER — Ambulatory Visit (INDEPENDENT_AMBULATORY_CARE_PROVIDER_SITE_OTHER): Payer: Medicare Other | Admitting: Podiatry

## 2015-03-03 ENCOUNTER — Encounter: Payer: Self-pay | Admitting: Podiatry

## 2015-03-03 DIAGNOSIS — L600 Ingrowing nail: Secondary | ICD-10-CM

## 2015-03-03 NOTE — Progress Notes (Signed)
She presents today for follow-up second digit tibial border right foot status post matrixectomy 2 weeks ago. She states that she stop soaking and is no longer using the medication. She says the toe feels good.  Objective: Vital signs are stable she is alert and oriented 3. No erythema edema cellulitis drainage or odor. Second digit right foot appears to be healing well.  Assessment: Healing second digit right foot status post matrixectomy.  Plan: Encouraged her to soak it for a while longer Epsom salts and warm water covered in the daily living at night. Follow up with me should this become more red or painful.

## 2015-03-03 NOTE — Patient Instructions (Signed)

## 2015-07-07 ENCOUNTER — Encounter: Payer: Self-pay | Admitting: *Deleted

## 2015-09-22 ENCOUNTER — Encounter: Payer: Self-pay | Admitting: *Deleted

## 2015-10-01 ENCOUNTER — Ambulatory Visit (INDEPENDENT_AMBULATORY_CARE_PROVIDER_SITE_OTHER): Payer: Medicare Other | Admitting: General Surgery

## 2015-10-01 ENCOUNTER — Encounter: Payer: Self-pay | Admitting: General Surgery

## 2015-10-01 VITALS — BP 136/84 | HR 78 | Resp 13 | Ht 62.0 in | Wt 180.0 lb

## 2015-10-01 DIAGNOSIS — Z1211 Encounter for screening for malignant neoplasm of colon: Secondary | ICD-10-CM

## 2015-10-01 MED ORDER — POLYETHYLENE GLYCOL 3350 17 GM/SCOOP PO POWD: ORAL | Status: DC

## 2015-10-01 NOTE — Patient Instructions (Signed)
Colonoscopy A colonoscopy is an exam to look at the entire large intestine (colon). This exam can help find problems such as tumors, polyps, inflammation, and areas of bleeding. The exam takes about 1 hour.  LET YOUR HEALTH CARE PROVIDER KNOW ABOUT:   Any allergies you have.  All medicines you are taking, including vitamins, herbs, eye drops, creams, and over-the-counter medicines.  Previous problems you or members of your family have had with the use of anesthetics.  Any blood disorders you have.  Previous surgeries you have had.  Medical conditions you have. RISKS AND COMPLICATIONS  Generally, this is a safe procedure. However, as with any procedure, complications can occur. Possible complications include:  Bleeding.  Tearing or rupture of the colon wall.  Reaction to medicines given during the exam.  Infection (rare). BEFORE THE PROCEDURE   Ask your health care provider about changing or stopping your regular medicines.  You may be prescribed an oral bowel prep. This involves drinking a large amount of medicated liquid, starting the day before your procedure. The liquid will cause you to have multiple loose stools until your stool is almost clear or light green. This cleans out your colon in preparation for the procedure.  Do not eat or drink anything else once you have started the bowel prep, unless your health care provider tells you it is safe to do so.  Arrange for someone to drive you home after the procedure. PROCEDURE   You will be given medicine to help you relax (sedative).  You will lie on your side with your knees bent.  A long, flexible tube with a light and camera on the end (colonoscope) will be inserted through the rectum and into the colon. The camera sends video back to a computer screen as it moves through the colon. The colonoscope also releases carbon dioxide gas to inflate the colon. This helps your health care provider see the area better.  During  the exam, your health care provider may take a small tissue sample (biopsy) to be examined under a microscope if any abnormalities are found.  The exam is finished when the entire colon has been viewed. AFTER THE PROCEDURE   Do not drive for 24 hours after the exam.  You may have a small amount of blood in your stool.  You may pass moderate amounts of gas and have mild abdominal cramping or bloating. This is caused by the gas used to inflate your colon during the exam.  Ask when your test results will be ready and how you will get your results. Make sure you get your test results.   This information is not intended to replace advice given to you by your health care provider. Make sure you discuss any questions you have with your health care provider.   Document Released: 03/12/2000 Document Revised: 01/03/2013 Document Reviewed: 11/20/2012 Elsevier Interactive Patient Education 2016 Elsevier Inc.  

## 2015-10-01 NOTE — Progress Notes (Signed)
Patient ID: Valerie Farmer, female   DOB: [birth year], 65 y.o.   MRN: [id number]  Chief Complaint  Patient presents with  . Colonoscopy    HPI Valerie Farmer is a 65 y.o. female.  Who presents for a colonoscopy discussion. The last colonoscopy was completed in 2007. Denies any gastrointestinal issues. She started a Probiotic supplement about a year ago due to burping and gas, this has helped. Bowels move regular and no bleeding noted. She states that she is still having some discomfort in the left breast.  I personally reviewed the patient's history. HPI  Past Medical History  Diagnosis Date  . Swelling   . Diabetes ([place])   . HBP (high blood pressure)   . Skin cancer   . Pancreatitis 2005    Past Surgical History  Procedure Laterality Date  . Cholecystectomy  2005  . Vaginal hysterectomy  2005  . Colonoscopy  2007    DR. Fannye Myer, normal study  . Breast surgery Left 09/05/2013    FNA: Comments: LEFT BREAST  . Breast surgery Right 1990    Family History  Problem Relation Age of Onset  . Bladder Cancer Father   . Breast cancer Paternal Aunt     Social History Social History  Substance Use Topics  . Smoking status: Never Smoker   . Smokeless tobacco: Never Used  . Alcohol Use: No    No Known Allergies  Current Outpatient Prescriptions  Medication Sig Dispense Refill  . aspirin EC 81 MG tablet Take by mouth.    Marland Kitchen atorvastatin (LIPITOR) 20 MG tablet Take by mouth.    . Calcium Carb-Cholecalciferol (CALCIUM 600 + D PO) Take by mouth daily.    . Cholecalciferol (VITAMIN D-3 PO) Take by mouth daily.    Marland Kitchen escitalopram (LEXAPRO) 10 MG tablet Take 10 mg by mouth. Take 1 1/2 tabs daily    . glipiZIDE (GLUCOTROL) 10 MG tablet Take 5 mg by mouth.     . losartan-hydrochlorothiazide (HYZAAR) 50-12.5 MG tablet Take by mouth.    . metformin (FORTAMET) 1000 MG (OSM) 24 hr tablet   0  . mometasone (NASONEX) 50 MCG/ACT nasal spray Place into the nose as needed.     . ONE TOUCH  ULTRA TEST test strip     . Probiotic Product (ALIGN PO) Take 1 tablet by mouth daily.     Marland Kitchen ALPRAZolam (XANAX) 0.25 MG tablet Take by mouth at bedtime as needed. Reported on 10/01/2015    . polyethylene glycol powder (GLYCOLAX/MIRALAX) powder 255 grams one bottle for colonoscopy prep 255 g 0   No current facility-administered medications for this visit.    Review of Systems Review of Systems  Constitutional: Negative.   Respiratory: Negative.   Cardiovascular: Negative.   Gastrointestinal: Negative.     Blood pressure 136/84, pulse 78, resp. rate 13, height 5\' 2"  (1.575 m), weight 180 lb (81.647 kg).  Physical Exam Physical Exam  Constitutional: She is oriented to person, place, and time. She appears well-developed and well-nourished.  Eyes: Conjunctivae are normal. No scleral icterus.  Neck: Neck supple.  Cardiovascular: Normal rate, regular rhythm and normal heart sounds.   Pulmonary/Chest: Effort normal and breath sounds normal. Right breast exhibits no inverted nipple, no mass, no nipple discharge, no skin change and no tenderness. Left breast exhibits no inverted nipple, no mass, no nipple discharge, no skin change and no tenderness. Breasts are asymmetrical (left breast larger than right).    Abdominal: Soft. Normal appearance and bowel  sounds are normal.  Lymphadenopathy:    She has no cervical adenopathy.  Neurological: She is alert and oriented to person, place, and time.  Skin: Skin is warm and dry.  Psychiatric: She has a normal mood and affect.    Data Reviewed 06/30/2015 laboratory studies reviewed: CBC unremarkable. Comprehensive metabolic panel notable for a creatinine of 1.2 with an estimated GFR 45. IHemoglobin A1c: 6.9.  Assessment    Candidate for screening colonoscopy.    Plan        Colonoscopy with possible biopsy/polypectomy prn: Information regarding the procedure, including its potential risks and complications (including but not limited to  perforation of the bowel, which may require emergency surgery to repair, and bleeding) was verbally given to the patient. Educational information regarding lower intestinal endoscopy was given to the patient. Written instructions for how to complete the bowel prep using Miralax were provided. The importance of drinking ample fluids to avoid dehydration as a result of the prep emphasized.  Patient has been scheduled for a colonoscopy on 11-10-15 at [facility]. This patient has been asked to hold metformin day of colonoscopy prep and procedure. It is okay for patient to continue glipizide day of colonoscopy prep. Also, okay for patient to continue 81 mg aspirin once daily.    PCP:  Derinda Late This has been scribed by Lesly Rubenstein LPN   Robert Bellow 10/01/2015, 8:53 PM

## 2015-11-06 ENCOUNTER — Encounter: Payer: Self-pay | Admitting: *Deleted

## 2015-11-10 ENCOUNTER — Ambulatory Visit
Admission: RE | Admit: 2015-11-10 | Discharge: 2015-11-10 | Disposition: A | Discharge status: Home / Self Care | Payer: Medicare Other | Source: Ambulatory Visit | Attending: General Surgery | Admitting: General Surgery

## 2015-11-10 ENCOUNTER — Encounter
Admission: RE | Disposition: A | Discharge status: Home / Self Care | Payer: Self-pay | Source: Ambulatory Visit | Attending: General Surgery

## 2015-11-10 ENCOUNTER — Ambulatory Visit: Payer: Medicare Other | Admitting: Anesthesiology

## 2015-11-10 ENCOUNTER — Encounter: Payer: Self-pay | Admitting: *Deleted

## 2015-11-10 DIAGNOSIS — K5730 Diverticulosis of large intestine without perforation or abscess without bleeding: Secondary | ICD-10-CM | POA: Diagnosis not present

## 2015-11-10 DIAGNOSIS — Z7984 Long term (current) use of oral hypoglycemic drugs: Secondary | ICD-10-CM | POA: Diagnosis not present

## 2015-11-10 DIAGNOSIS — Z8719 Personal history of other diseases of the digestive system: Secondary | ICD-10-CM | POA: Diagnosis not present

## 2015-11-10 DIAGNOSIS — Z7982 Long term (current) use of aspirin: Secondary | ICD-10-CM | POA: Insufficient documentation

## 2015-11-10 DIAGNOSIS — I10 Essential (primary) hypertension: Secondary | ICD-10-CM | POA: Insufficient documentation

## 2015-11-10 DIAGNOSIS — Z79899 Other long term (current) drug therapy: Secondary | ICD-10-CM | POA: Diagnosis not present

## 2015-11-10 DIAGNOSIS — Z85828 Personal history of other malignant neoplasm of skin: Secondary | ICD-10-CM | POA: Insufficient documentation

## 2015-11-10 DIAGNOSIS — E119 Type 2 diabetes mellitus without complications: Secondary | ICD-10-CM | POA: Insufficient documentation

## 2015-11-10 DIAGNOSIS — Z1211 Encounter for screening for malignant neoplasm of colon: Secondary | ICD-10-CM | POA: Diagnosis not present

## 2015-11-10 HISTORY — PX: COLONOSCOPY WITH PROPOFOL: SHX5780

## 2015-11-10 HISTORY — DX: Essential (primary) hypertension: I10

## 2015-11-10 LAB — GLUCOSE, CAPILLARY: GLUCOSE-CAPILLARY: 169 mg/dL — AB (ref 65–99)

## 2015-11-10 SURGERY — COLONOSCOPY WITH PROPOFOL
Anesthesia: General

## 2015-11-10 MED ORDER — PROPOFOL 500 MG/50ML IV EMUL
INTRAVENOUS | Status: DC | PRN
  Administered 2015-11-10: 100 ug/kg/min via INTRAVENOUS

## 2015-11-10 MED ORDER — SODIUM CHLORIDE 0.9 % IV SOLN
INTRAVENOUS | Status: DC
  Administered 2015-11-10: 1000 mL via INTRAVENOUS

## 2015-11-10 MED ORDER — FENTANYL CITRATE (PF) 100 MCG/2ML IJ SOLN
INTRAMUSCULAR | Status: DC | PRN
Start: 2015-11-10 — End: 2015-11-10
  Administered 2015-11-10: 50 ug via INTRAVENOUS

## 2015-11-10 MED ORDER — FENTANYL CITRATE (PF) 100 MCG/2ML IJ SOLN: 25.0000 ug | INTRAMUSCULAR | Status: DC | PRN

## 2015-11-10 MED ORDER — MIDAZOLAM HCL 2 MG/2ML IJ SOLN
INTRAMUSCULAR | Status: DC | PRN
  Administered 2015-11-10: 1 mg via INTRAVENOUS

## 2015-11-10 MED ORDER — ONDANSETRON HCL 4 MG/2ML IJ SOLN: 4.0000 mg | Freq: Once | INTRAMUSCULAR | Status: DC | PRN

## 2015-11-10 NOTE — Op Note (Signed)
[facility]astroenter[facility] Patient Name: Valerie Farmer Procedure Date: 11/10/2015 8:08 AM MRN: [id number] Account #: [id number] Date of Birth: [birth year] Admit Type: Outpatient Age: 65 Room: [facility] ENDO ROOM 4 Gender: Female Note Status: Finalized Procedure:            Colonoscopy Indications:          Screening for colorectal malignant neoplasm Providers:            Robert Bellow, MD Referring MD:         Caprice Renshaw MD (Referring MD) Medicines:            Monitored Anesthesia Care Complications:        No immediate complications. Procedure:            Pre-Anesthesia Assessment:                       - Prior to the procedure, a History and Physical was                        performed, and patient medications, allergies and                        sensitivities were reviewed. The patient's tolerance of                        previous anesthesia was reviewed.                       - The risks and benefits of the procedure and the                        sedation options and risks were discussed with the                        patient. All questions were answered and informed                        consent was obtained.                       After obtaining informed consent, the colonoscope was                        passed under direct vision. Throughout the procedure,                        the patient's blood pressure, pulse, and oxygen                        saturations were monitored continuously. The                        Colonoscope was introduced through the anus and                        advanced to the the cecum, identified by appendiceal                        orifice and ileocecal valve. The colonoscopy was  performed without difficulty. The patient tolerated the                        procedure well. The quality of the bowel preparation                        was excellent. Findings:      A few medium-mouthed  diverticula were found in the sigmoid colon.      The retroflexed view of the distal rectum and anal verge was normal and       showed no anal or rectal abnormalities. Impression:           - Diverticulosis in the sigmoid colon.                       - The distal rectum and anal verge are normal on                        retroflexion view.                       - No specimens collected. Recommendation:       - Repeat colonoscopy in 10 years for screening purposes. Procedure Code(s):    --- Professional ---                       [phone], Colonoscopy, flexible; diagnostic, including                        collection of specimen(s) by brushing or washing, when                        performed (separate procedure) Diagnosis Code(s):    --- Professional ---                       Z12.11, Encounter for screening for malignant neoplasm                        of colon                       K57.30, Diverticulosis of large intestine without                        perforation or abscess without bleeding CPT copyright 2016 American Medical Association. All rights reserved. The codes documented in this report are preliminary and upon coder review may  be revised to meet current compliance requirements. Robert Bellow, MD 11/10/2015 8:32:52 AM This report has been signed electronically. Number of Addenda: 0 Note Initiated On: 11/10/2015 8:08 AM Scope Withdrawal Time: 0 hours 9 minutes 9 seconds  Total Procedure Duration: 0 hours 14 minutes 34 seconds       [facility]

## 2015-11-10 NOTE — Anesthesia Procedure Notes (Signed)
Performed by: COOK-MARTIN, Anneke Cundy Pre-anesthesia Checklist: Patient identified, Emergency Drugs available, Suction available, Patient being monitored and Timeout performed Patient Re-evaluated:Patient Re-evaluated prior to inductionOxygen Delivery Method: Nasal cannula Preoxygenation: Pre-oxygenation with 100% oxygen Intubation Type: IV induction Placement Confirmation: positive ETCO2 and CO2 detector       

## 2015-11-10 NOTE — Transfer of Care (Signed)
Immediate Anesthesia Transfer of Care Note  Patient: Valerie Farmer  Procedure(s) Performed: Procedure(s): COLONOSCOPY WITH PROPOFOL (N/A)  Patient Location: PACU  Anesthesia Type:General  Level of Consciousness: awake, alert  and sedated  Airway & Oxygen Therapy: Patient Spontanous Breathing and Patient connected to nasal cannula oxygen  Post-op Assessment: Report given to RN  Post vital signs: Reviewed and stable  Last Vitals:  Vitals:   11/10/15 0728  BP: (!) 143/67  Pulse: 73  Resp: 18  Temp: 36.9 C    Last Pain:  Vitals:   11/10/15 0728  TempSrc: Tympanic         Complications: No apparent anesthesia complications

## 2015-11-10 NOTE — H&P (Signed)
Valerie Farmer [id number] [birth year]     HPI: No change in clinical history or exam. Tolerated prep well.   Prescriptions Prior to Admission  Medication Sig Dispense Refill Last Dose  . aspirin EC 81 MG tablet Take by mouth.   11/07/2015 at Unknown time  . Calcium Carb-Cholecalciferol (CALCIUM 600 + D PO) Take by mouth daily.   11/09/2015 at Unknown time  . losartan-hydrochlorothiazide (HYZAAR) 50-12.5 MG tablet Take by mouth.   11/10/2015 at 0600  . ALPRAZolam (XANAX) 0.25 MG tablet Take by mouth at bedtime as needed. Reported on 10/01/2015   Not Taking  . atorvastatin (LIPITOR) 20 MG tablet Take by mouth.   11/07/2015  . Cholecalciferol (VITAMIN D-3 PO) Take by mouth daily.   11/07/2015  . escitalopram (LEXAPRO) 10 MG tablet Take 10 mg by mouth. Take 1 1/2 tabs daily   11/09/2015  . glipiZIDE (GLUCOTROL) 10 MG tablet Take 5 mg by mouth.    Taking  . metformin (FORTAMET) 1000 MG (OSM) 24 hr tablet   0 11/07/2015  . mometasone (NASONEX) 50 MCG/ACT nasal spray Place into the nose as needed.    Taking  . ONE TOUCH ULTRA TEST test strip    Taking  . polyethylene glycol powder (GLYCOLAX/MIRALAX) powder 255 grams one bottle for colonoscopy prep 255 g 0   . Probiotic Product (ALIGN PO) Take 1 tablet by mouth daily.    11/08/2015   No Known Allergies Past Medical History:  Diagnosis Date  . Diabetes ([place])   . HBP (high blood pressure)   . Hypertension   . Pancreatitis 2005  . Skin cancer   . Swelling    Past Surgical History:  Procedure Laterality Date  . BREAST SURGERY Left 09/05/2013   FNA: Comments: LEFT BREAST  . BREAST SURGERY Right 1990  . CHOLECYSTECTOMY  2005  . COLONOSCOPY  2007   DR. Byrnett, normal study  . VAGINAL HYSTERECTOMY  2005   Social History   Social History  . Marital status: Married    Spouse name: N/A  . Number of children: N/A  . Years of education: N/A   Occupational History  . Not on file.   Social History Main Topics  . Smoking status: Never Smoker  .  Smokeless tobacco: Never Used  . Alcohol use No  . Drug use: No  . Sexual activity: Not on file   Other Topics Concern  . Not on file   Social History Narrative  . No narrative on file   Social History   Social History Narrative  . No narrative on file     ROS: Negative.     PE: HEENT: Negative. Lungs: Clear. Cardio: RR.  Assessment/Plan:  Proceed with planned endoscopy.  Robert Bellow 11/10/2015

## 2015-11-10 NOTE — Anesthesia Postprocedure Evaluation (Signed)
Anesthesia Post Note  Patient: Valerie Farmer  Procedure(s) Performed: Procedure(s) (LRB): COLONOSCOPY WITH PROPOFOL (N/A)  Patient location during evaluation: PACU Anesthesia Type: General Level of consciousness: awake and alert and oriented Pain management: pain level controlled Vital Signs Assessment: post-procedure vital signs reviewed and stable Respiratory status: spontaneous breathing Cardiovascular status: blood pressure returned to baseline Anesthetic complications: no    Last Vitals:  Vitals:   11/10/15 0850 11/10/15 0900  BP: 114/67 112/63  Pulse:    Resp:  16  Temp:      Last Pain:  Vitals:   11/10/15 0835  TempSrc: Tympanic                 Valerie Farmer

## 2015-11-10 NOTE — Anesthesia Preprocedure Evaluation (Addendum)
Anesthesia Evaluation  Patient identified by MRN, date of birth, ID band Patient awake    Reviewed: Allergy & Precautions, NPO status , Patient's Chart, lab work & pertinent test results  Airway Mallampati: III  TM Distance: <3 FB    Comment: Short chin Dental no notable dental hx. (+) Chipped   Pulmonary neg pulmonary ROS,    Pulmonary exam normal        Cardiovascular hypertension, Pt. on medications Normal cardiovascular exam     Neuro/Psych Anxiety negative neurological ROS     GI/Hepatic negative GI ROS, Neg liver ROS,   Endo/Other  diabetes, Well Controlled, Type 2, Oral Hypoglycemic Agents  Renal/GU negative Renal ROS  negative genitourinary   Musculoskeletal negative musculoskeletal ROS (+)   Abdominal Normal abdominal exam  (+)   Peds negative pediatric ROS (+)  Hematology negative hematology ROS (+)   Anesthesia Other Findings   Reproductive/Obstetrics                            Anesthesia Physical Anesthesia Plan  ASA: II  Anesthesia Plan: General   Post-op Pain Management:    Induction: Intravenous  Airway Management Planned: Nasal Cannula  Additional Equipment:   Intra-op Plan:   Post-operative Plan:   Informed Consent: I have reviewed the patients History and Physical, chart, labs and discussed the procedure including the risks, benefits and alternatives for the proposed anesthesia with the patient or authorized representative who has indicated his/her understanding and acceptance.   Dental advisory given  Plan Discussed with: CRNA and Surgeon  Anesthesia Plan Comments:         Anesthesia Quick Evaluation

## 2015-11-11 ENCOUNTER — Encounter: Payer: Self-pay | Admitting: General Surgery

## 2016-03-10 ENCOUNTER — Encounter: Payer: Self-pay | Admitting: *Deleted

## 2016-03-10 ENCOUNTER — Encounter: Payer: Medicare Other | Attending: Family Medicine | Admitting: *Deleted

## 2016-03-10 VITALS — BP 136/82 | Ht 62.0 in | Wt 179.7 lb

## 2016-03-10 DIAGNOSIS — Z6832 Body mass index (BMI) 32.0-32.9, adult: Secondary | ICD-10-CM | POA: Insufficient documentation

## 2016-03-10 DIAGNOSIS — Z713 Dietary counseling and surveillance: Secondary | ICD-10-CM | POA: Diagnosis not present

## 2016-03-10 DIAGNOSIS — E119 Type 2 diabetes mellitus without complications: Secondary | ICD-10-CM | POA: Diagnosis present

## 2016-03-10 NOTE — Patient Instructions (Addendum)
Check blood sugars 1 x day before breakfast or 2 hrs after supper every day  Exercise: Begin walking   for   15  minutes   3 days a week and gradually increase to 150 minutes/week  Eat 3 meals day,  1-2  snacks a day Space meals 4-6 hours apart Complete 3 Day Food Record and bring to next appt  Bring blood sugar records to the next appointment  Return for appointment on:  Wednesday April 14, 2016 at 1:15 pm with [facility] (dietitian)

## 2016-03-11 NOTE — Progress Notes (Signed)
Diabetes Self-Management Education  Visit Type: First/Initial  Appt. Start Time: 1530 Appt. End Time: 1630  03/10/2016  [name], identified by name and date of birth, is a 65 y.o. female with a diagnosis of Diabetes: Type 2.   ASSESSMENT  Blood pressure 136/82, height 5\' 2"  (1.575 m), weight 179 lb 11.2 oz (81.5 kg). Body mass index is 32.87 kg/m.      Diabetes Self-Management Education - 03/10/16 1649      Visit Information   Visit Type First/Initial     Initial Visit   Diabetes Type Type 2   Are you currently following a meal plan? No   What type of meal plan do you follow? N/A   Are you taking your medications as prescribed? Yes   Date Diagnosed 19 years     Health Coping   How would you rate your overall health? Good     Psychosocial Assessment   Patient Belief/Attitude about Diabetes Motivated to manage diabetes  "bothersome, left out"   Self-care barriers None   Self-management support Doctor's office   Patient Concerns Nutrition/Meal planning;Glycemic Control;Weight Control;Monitoring   Special Needs None   Preferred Learning Style Visual;Auditory   Learning Readiness Ready   How often do you need to have someone help you when you read instructions, pamphlets, or other written materials from your doctor or pharmacy? 1 - Never   What is the last grade level you completed in school? college     Pre-Education Assessment   Patient understands the diabetes disease and treatment process. Needs Review   Patient understands incorporating nutritional management into lifestyle. Needs Instruction   Patient undertands incorporating physical activity into lifestyle. Needs Instruction   Patient understands using medications safely. Needs Review   Patient understands monitoring blood glucose, interpreting and using results Needs Review   Patient understands prevention, detection, and treatment of acute complications. Needs Instruction   Patient understands  prevention, detection, and treatment of chronic complications. Needs Review   Patient understands how to develop strategies to address psychosocial issues. Needs Instruction   Patient understands how to develop strategies to promote health/change behavior. Needs Instruction     Complications   Last HgB A1C per patient/outside source 8.2 %  02/10/16   How often do you check your blood sugar? 3-4 times / week   Fasting Blood glucose range (mg/dL) 130-179  Pt reports FBG's 140-150's mg/dL.   Have you had a dilated eye exam in the past 12 months? Yes   Have you had a dental exam in the past 12 months? Yes   Are you checking your feet? Yes   How many days per week are you checking your feet? 4     Dietary Intake   Breakfast peanut butter sandwich with banana or sugar free jelly; peanut butter and English muffin   Lunch peanut butter crackers   Snack (afternoon) soup, salad, cereal   Dinner meat and 2 vegetables (potatoes, beans, peas, corn)   Beverage(s) water, diet soda     Exercise   Exercise Type ADL's     Patient Education   Previous Diabetes Education Yes (please comment)  "years ago - here at [facility]"   Disease state  Explored patient's options for treatment of their diabetes   Nutrition management  Role of diet in the treatment of diabetes and the relationship between the three main macronutrients and blood glucose level;Food label reading, portion sizes and measuring food.;Carbohydrate counting   Physical activity and exercise  Role of exercise on diabetes management, blood pressure control and cardiac health.   Medications Reviewed patients medication for diabetes, action, purpose, timing of dose and side effects.   Monitoring Purpose and frequency of SMBG.;Taught/discussed recording of test results and interpretation of SMBG.;Identified appropriate SMBG and/or A1C goals.   Chronic complications Relationship between chronic complications and blood glucose control   Psychosocial  adjustment Identified and addressed patients feelings and concerns about diabetes     Individualized Goals (developed by patient)   Reducing Risk Improve blood sugars Prevent diabetes complications Lose weight Become more fit     Outcomes   Expected Outcomes Demonstrated interest in learning. Expect positive outcomes   Future DMSE 4-6 wks      Individualized Plan for Diabetes Self-Management Training:   Learning Objective:  Patient will have a greater understanding of diabetes self-management. Patient education plan is to attend individual and/or group sessions per assessed needs and concerns.   Plan:   Patient Instructions  Check blood sugars 1 x day before breakfast or 2 hrs after supper every day Exercise: Begin walking   for   15  minutes   3 days a week and gradually increase to 150 minutes/week Eat 3 meals day,  1-2  snacks a day Space meals 4-6 hours apart Complete 3 Day Food Record and bring to next appt Bring blood sugar records to the next appointment Return for appointment on:  Wednesday April 14, 2016 at 1:15 pm with Pam (dietitian)  Expected Outcomes:  Demonstrated interest in learning. Expect positive outcomes  Education material provided: General Meal Planning Guidelines Simple Meal Plan 3 Day Food Record  If problems or questions, patient to contact team via:  Johny Drilling, RN, CCM, CDE [phone]  Future DSME appointment: 4-6 wks  April 14, 2016 for appointment with the dietitian

## 2016-04-13 ENCOUNTER — Telehealth: Payer: Self-pay | Admitting: Dietician

## 2016-04-13 NOTE — Telephone Encounter (Signed)
Called patient to discuss appointment schedule in view of impending inclement weather. Advised her to call prior to coming in case office is closed, and if closed, we will contact her to reschedule.

## 2016-04-14 ENCOUNTER — Ambulatory Visit: Payer: Medicare Other | Admitting: Dietician

## 2016-04-20 ENCOUNTER — Ambulatory Visit: Payer: Medicare Other | Admitting: Dietician

## 2016-04-23 ENCOUNTER — Ambulatory Visit: Payer: Medicare Other | Admitting: Dietician

## 2016-04-27 ENCOUNTER — Encounter: Payer: Self-pay | Admitting: Dietician

## 2016-04-27 ENCOUNTER — Encounter: Payer: Medicare Other | Attending: Family Medicine | Admitting: Dietician

## 2016-04-27 VITALS — Wt 173.0 lb

## 2016-04-27 DIAGNOSIS — Z6832 Body mass index (BMI) 32.0-32.9, adult: Secondary | ICD-10-CM | POA: Insufficient documentation

## 2016-04-27 DIAGNOSIS — E119 Type 2 diabetes mellitus without complications: Secondary | ICD-10-CM | POA: Diagnosis present

## 2016-04-27 DIAGNOSIS — Z713 Dietary counseling and surveillance: Secondary | ICD-10-CM | POA: Insufficient documentation

## 2016-04-27 NOTE — Progress Notes (Signed)
Diabetes Self-Management Education  Visit Type:  Follow-up  Appt. Start Time: 1330 Appt. End Time: 1430  04/27/2016  [name], identified by name and date of birth, is a 66 y.o. female with a diagnosis of Diabetes:  Marland Kitchen Type 2 diabetes  ASSESSMENT  Weight 173 lb (78.5 kg). Body mass index is 31.64 kg/m.       Diabetes Self-Management Education - [id number]      Complications   Last HgB A1C per patient/outside source 8.2 %   How often do you check your blood sugar? 3-4 times / week   Fasting Blood glucose range (mg/dL) 130-179;70-129   Postprandial Blood glucose range (mg/dL) 180-200;>200   Have you had a dilated eye exam in the past 12 months? No   Have you had a dental exam in the past 12 months? Yes   Are you checking your feet? Yes   How many days per week are you checking your feet? 4     Dietary Intake   Breakfast 8:30am- English muffin with peanut butter or 1 cup of oatmeal   Lunch 12:30- Banana and peanut butter sandwich on sandwich thins or salad with 2/3 cup pasta and nuts   Snack (afternoon) 1/4 cup chex mix   Dinner 6:00pm- pork chop, med. sweet potato, baked beans or chicken and vegetable stir fry over 3/4 cup rice   Snack (evening) spiced nuts or trail mix or no sugar added ice cream   Beverage(s) water and diet soda     Exercise   Exercise Type ADL's     Patient Education   Nutrition management  Role of diet in the treatment of diabetes and the relationship between the three main macronutrients and blood glucose level;Food label reading, portion sizes and measuring food.;Carbohydrate counting;Reviewed blood glucose goals for pre and post meals and how to evaluate the patients' food intake on their blood glucose level.;Information on hints to eating out and maintain blood glucose control.   Physical activity and exercise  Role of exercise on diabetes management, blood pressure control and cardiac health.   Chronic complications Relationship between  chronic complications and blood glucose control;Lipid levels, blood glucose control and heart disease     Individualized Goals (developed by patient)   Nutrition Follow meal plan discussed;General guidelines for healthy choices and portions discussed   Physical Activity Exercise 1-2 times per week   Monitoring  test my blood glucose as discussed     Post-Education Assessment   Patient understands the diabetes disease and treatment process. Demonstrates understanding / competency   Patient understands incorporating nutritional management into lifestyle. Demonstrates understanding / competency   Patient undertands incorporating physical activity into lifestyle. Demonstrates understanding / competency   Patient understands using medications safely. Demonstrates understanding / competency   Patient understands monitoring blood glucose, interpreting and using results Demonstrates understanding / competency   Patient understands prevention, detection, and treatment of acute complications. Demonstrates understanding / competency   Patient understands prevention, detection, and treatment of chronic complications. Needs Review   Patient understands how to develop strategies to promote health/change behavior. Demonstrates understanding / competency     Outcomes   Program Status Completed      Learning Objective:  Patient will have a greater understanding of diabetes self-management. Patient education plan is to attend individual and/or group sessions per assessed needs and concerns.   Plan:   Patient Instructions  Use Planning a Balanced Meal hand-out and food guide plate to help plan meals. Balance  meals with protein, 1-2 servings of starch and "free vegetables". Can add fruit to any meal. For afternoon snack, include 1 serving of carbohydrate. Refer to snack list.  Measure out some portions of starchy foods so can better judge portions when "out". Try Mueller's 100% whole wheat pasta. Use  calorieking.com website to look up nutrition information                        Expected Outcomes:  Demonstrated interest in learning. Expect positive outcomes  Education material provided: Diabetes Nutrition Placemat, Planning a balanced meal, Snack option handout If problems or questions, patient to contact team via:  Karolee Stamps, [place], [place]  Future DSME appointment: - PRN

## 2016-04-27 NOTE — Patient Instructions (Signed)
Use Planning a Balanced Meal hand-out and food guide plate to help plan meals. Balance meals with protein, 1-2 servings of starch and "free vegetables". Can add fruit to any meal. For afternoon snack, include 1 serving of carbohydrate. Refer to snack list.  Measure out some portions of starchy foods so can better judge portions when "out". Try Mueller's 100% whole wheat pasta. Use calorieking.com website to look up nutrition information

## 2016-09-28 ENCOUNTER — Ambulatory Visit (INDEPENDENT_AMBULATORY_CARE_PROVIDER_SITE_OTHER): Payer: Medicare Other | Admitting: Obstetrics & Gynecology

## 2016-09-28 ENCOUNTER — Encounter: Payer: Self-pay | Admitting: Obstetrics & Gynecology

## 2016-09-28 VITALS — BP 148/80 | HR 79 | Ht 60.0 in | Wt 176.0 lb

## 2016-09-28 DIAGNOSIS — Z1239 Encounter for other screening for malignant neoplasm of breast: Secondary | ICD-10-CM

## 2016-09-28 DIAGNOSIS — Z0000 Encounter for general adult medical examination without abnormal findings: Secondary | ICD-10-CM

## 2016-09-28 DIAGNOSIS — Z1231 Encounter for screening mammogram for malignant neoplasm of breast: Secondary | ICD-10-CM

## 2016-09-28 DIAGNOSIS — Z01419 Encounter for gynecological examination (general) (routine) without abnormal findings: Secondary | ICD-10-CM | POA: Diagnosis not present

## 2016-09-28 NOTE — Patient Instructions (Signed)
PAP every 5 years Mammogram every year    Call [phone] to schedule Colonoscopy every 10 years Labs yearly (with PCP)

## 2016-09-28 NOTE — Progress Notes (Signed)
HPI:      [name] is a 66 y.o. G0P0000 who LMP was in the past, she presents today for her annual examination.  The patient has no complaints today. The patient is not sexually active. Herlast pap: approximate date 2017 and was normal and last mammogram: approximate date 2017 and was normal.  The patient does perform self breast exams.  There is no notable family history of breast or ovarian cancer in her family. The patient is not taking hormone replacement therapy. Patient denies post-menopausal vaginal bleeding.   The patient has regular exercise: yes. The patient denies current symptoms of depression.    GYN Hx: Last Colonoscopy:< 1 year ago. Normal.  Last DEXA: 2 mos ago.    PMHx: Past Medical History:  Diagnosis Date  . Anxiety   . Diabetes ([place])   . HBP (high blood pressure)   . Hyperlipidemia   . Hypertension   . Pancreatitis 2005  . Skin cancer   . Swelling    Past Surgical History:  Procedure Laterality Date  . BREAST SURGERY Left 09/05/2013   FNA: Comments: LEFT BREAST  . BREAST SURGERY Right 1990  . CHOLECYSTECTOMY  2005  . COLONOSCOPY  2007   DR. Byrnett, normal study  . COLONOSCOPY WITH PROPOFOL N/A 11/10/2015   Procedure: COLONOSCOPY WITH PROPOFOL;  Surgeon: Eyonna Sandstrom Bellow, MD;  Location: [facility] ENDOSCOPY;  Service: Endoscopy;  Laterality: N/A;  . VAGINAL HYSTERECTOMY  2005   Family History  Problem Relation Age of Onset  . Bladder Cancer Father   . Breast cancer Paternal Aunt   . Leukemia Maternal Aunt   . Bladder Cancer Maternal Uncle    Social History  Substance Use Topics  . Smoking status: Never Smoker  . Smokeless tobacco: Never Used  . Alcohol use No    Current Outpatient Prescriptions:  .  ALPRAZolam (XANAX) 0.25 MG tablet, Take by mouth at bedtime as needed. Reported on 10/01/2015, Disp: , Rfl:  .  aspirin EC 81 MG tablet, Take 81 mg by mouth daily. , Disp: , Rfl:  .  atorvastatin (LIPITOR) 20 MG tablet, Take 20 mg by mouth daily.  , Disp: , Rfl:  .  Calcium Carb-Cholecalciferol (CALCIUM 600 + D PO), Take 1 tablet by mouth daily. , Disp: , Rfl:  .  Cholecalciferol (VITAMIN D-3 PO), Take 1 tablet by mouth daily. , Disp: , Rfl:  .  clobetasol cream (TEMOVATE) 1.02 %, Apply 1 application topically 2 (two) times daily., Disp: , Rfl:  .  escitalopram (LEXAPRO) 10 MG tablet, Take 10 mg by mouth. Take 1 1/2 tabs daily, Disp: , Rfl:  .  glipiZIDE (GLUCOTROL) 10 MG tablet, Take 5 mg by mouth daily before breakfast. , Disp: , Rfl:  .  losartan-hydrochlorothiazide (HYZAAR) 50-12.5 MG tablet, Take 1 tablet by mouth daily. , Disp: , Rfl:  .  metformin (FORTAMET) 1000 MG (OSM) 24 hr tablet, Take 1,000 mg by mouth daily with breakfast. , Disp: , Rfl: 0 .  mometasone (NASONEX) 50 MCG/ACT nasal spray, Place 2 sprays into the nose daily as needed. , Disp: , Rfl:  .  ONE TOUCH ULTRA TEST test strip, , Disp: , Rfl:  .  Probiotic Product (ALIGN PO), Take 1 tablet by mouth daily. , Disp: , Rfl:  Allergies: Patient has no known allergies.  Review of Systems  Constitutional: Negative for chills, fever and malaise/fatigue.  HENT: Negative for congestion, sinus pain and sore throat.   Eyes: Negative for  blurred vision and pain.  Respiratory: Negative for cough and wheezing.   Cardiovascular: Negative for chest pain and leg swelling.  Gastrointestinal: Negative for abdominal pain, constipation, diarrhea, heartburn, nausea and vomiting.  Genitourinary: Negative for dysuria, frequency, hematuria and urgency.  Musculoskeletal: Negative for back pain, joint pain, myalgias and neck pain.  Skin: Negative for itching and rash.  Neurological: Negative for dizziness, tremors and weakness.  Endo/Heme/Allergies: Does not bruise/bleed easily.  Psychiatric/Behavioral: Negative for depression. The patient is not nervous/anxious and does not have insomnia.     Objective: BP (!) 148/80   Pulse 79   Ht 5' (1.524 m)   Wt 176 lb (79.8 kg)   BMI 34.37 kg/m    Filed Weights   09/28/16 1009  Weight: 176 lb (79.8 kg)   Body mass index is 34.37 kg/m. Physical Exam  Constitutional: She is oriented to person, place, and time. She appears well-developed and well-nourished. No distress.  Genitourinary: Rectum normal and vagina normal. Pelvic exam was performed with patient supine. There is no rash or lesion on the right labia. There is no rash or lesion on the left labia. Vagina exhibits no lesion. No bleeding in the vagina. Right adnexum does not display mass and does not display tenderness. Left adnexum does not display mass and does not display tenderness.  Genitourinary Comments: Absent Uterus Absent cervix Vaginal cuff well healed  HENT:  Head: Normocephalic and atraumatic. Head is without laceration.  Right Ear: Hearing normal.  Left Ear: Hearing normal.  Nose: No epistaxis.  No foreign bodies.  Mouth/Throat: Uvula is midline, oropharynx is clear and moist and mucous membranes are normal.  Eyes: Pupils are equal, round, and reactive to light.  Neck: Normal range of motion. Neck supple. No thyromegaly present.  Cardiovascular: Normal rate and regular rhythm.  Exam reveals no gallop and no friction rub.   No murmur heard. Pulmonary/Chest: Effort normal and breath sounds normal. No respiratory distress. She has no wheezes. Right breast exhibits no mass, no skin change and no tenderness. Left breast exhibits no mass, no skin change and no tenderness.  Abdominal: Soft. Bowel sounds are normal. She exhibits no distension. There is no tenderness. There is no rebound.  Musculoskeletal: Normal range of motion.  Neurological: She is alert and oriented to person, place, and time. No cranial nerve deficit.  Skin: Skin is warm and dry.  Psychiatric: She has a normal mood and affect. Judgment normal.  Vitals reviewed.   Assessment: Annual Exam 1. Annual physical exam   2. Screening for breast cancer     Plan:            1.  Cervical Screening-   Pap smear schedule reviewed with patient, due 2022  2. Breast screening- Exam annually and mammogram scheduled  3. Colonoscopy every 10 years, Hemoccult testing after age 31  4. Labs managed by PCP  5. Counseling for hormonal therapy: none     F/U  Return in about 1 year (around 09/28/2017) for Annual.  Barnett Applebaum, MD, [facility], [facility] 09/28/2016  10:26 AM

## 2016-10-25 ENCOUNTER — Other Ambulatory Visit: Payer: Self-pay | Admitting: Obstetrics & Gynecology

## 2016-10-25 ENCOUNTER — Ambulatory Visit
Admission: RE | Admit: 2016-10-25 | Discharge: 2016-10-25 | Disposition: A | Discharge status: Home / Self Care | Payer: Medicare Other | Source: Ambulatory Visit | Attending: Obstetrics & Gynecology | Admitting: Obstetrics & Gynecology

## 2016-10-25 DIAGNOSIS — Z1231 Encounter for screening mammogram for malignant neoplasm of breast: Secondary | ICD-10-CM | POA: Insufficient documentation

## 2016-10-25 DIAGNOSIS — Z1239 Encounter for other screening for malignant neoplasm of breast: Secondary | ICD-10-CM

## 2016-10-28 ENCOUNTER — Other Ambulatory Visit: Payer: Self-pay | Admitting: *Deleted

## 2016-10-28 ENCOUNTER — Inpatient Hospital Stay
Admission: RE | Admit: 2016-10-28 | Discharge: 2016-10-28 | Disposition: A | Discharge status: Home / Self Care | Payer: Self-pay | Source: Ambulatory Visit | Attending: *Deleted | Admitting: *Deleted

## 2016-10-28 DIAGNOSIS — Z9289 Personal history of other medical treatment: Secondary | ICD-10-CM

## 2016-12-08 ENCOUNTER — Ambulatory Visit: Payer: Medicare Other | Admitting: Podiatry

## 2016-12-20 ENCOUNTER — Ambulatory Visit (INDEPENDENT_AMBULATORY_CARE_PROVIDER_SITE_OTHER): Payer: Medicare Other | Admitting: Podiatry

## 2016-12-20 ENCOUNTER — Encounter: Payer: Self-pay | Admitting: Podiatry

## 2016-12-20 DIAGNOSIS — B351 Tinea unguium: Secondary | ICD-10-CM | POA: Diagnosis not present

## 2016-12-20 DIAGNOSIS — M79676 Pain in unspecified toe(s): Secondary | ICD-10-CM | POA: Diagnosis not present

## 2016-12-21 NOTE — Progress Notes (Signed)
She presents today concerned about an ingrown toenail to the fourth digit of the right foot. She states that we fixed the second toe along the medial border which is really really well and was hoping that we can do that to the fourth toe. She states that the calluses on the bottom of the right foot was diagnosed as psoriasis by Dr. Nehemiah Massed. Her last hemoglobin A1c was 7.7   Objective: Vital signs are stable alert and oriented 3 pulses main palpable bilateral. Mild hammertoe deformity. Fourth toe right foot demonstrates a pincer type nail. Remainder of her nails appear to be thicker and slightly dystrophic.  Assessment: Diabetes non-complicated with dystrophic nail fourth digit right foot.  Plan: Debrided the nails for her today she did not understands that if I remove the margin of the nail it would not lay down flat and at this point she does not want a total nail removed.

## 2017-03-24 ENCOUNTER — Ambulatory Visit: Payer: Medicare Other | Admitting: Podiatry

## 2017-03-31 ENCOUNTER — Encounter: Payer: Self-pay | Admitting: Podiatry

## 2017-03-31 ENCOUNTER — Ambulatory Visit: Payer: Medicare Other | Admitting: Podiatry

## 2017-03-31 ENCOUNTER — Ambulatory Visit: Payer: Medicare Other

## 2017-03-31 DIAGNOSIS — E119 Type 2 diabetes mellitus without complications: Secondary | ICD-10-CM | POA: Diagnosis not present

## 2017-03-31 DIAGNOSIS — B351 Tinea unguium: Secondary | ICD-10-CM | POA: Diagnosis not present

## 2017-03-31 DIAGNOSIS — S92302A Fracture of unspecified metatarsal bone(s), left foot, initial encounter for closed fracture: Secondary | ICD-10-CM

## 2017-03-31 DIAGNOSIS — M79676 Pain in unspecified toe(s): Secondary | ICD-10-CM

## 2017-03-31 NOTE — Progress Notes (Signed)
Complaint:  Visit Type: Patient returns to my office for continued preventative foot care services. Complaint: Patient states" my nails have grown long and thick and become painful to walk and wear shoes" Patient has been diagnosed with DM with no foot complications. The patient presents for preventative foot care services. No changes to ROS.  During this visit she says she has developed pain on the top of her left foot.  She says this pain has been present for two weeks.  She says her foot swells after a days activity.  She also says she would like to have her painful left foot evaluated and treated  Podiatric Exam: Vascular: dorsalis pedis and posterior tibial pulses are palpable bilateral. Capillary return is immediate. Temperature gradient is WNL. Skin turgor WNL  Sensorium: Normal Semmes Weinstein monofilament test. Normal tactile sensation bilaterally. Nail Exam: Pt has thick disfigured discolored nails with subungual debris noted bilateral entire nail hallux through fifth toenails Ulcer Exam: There is no evidence of ulcer or pre-ulcerative changes or infection. Orthopedic Exam: Muscle tone and strength are WNL. No limitations in general ROM. No crepitus or effusions noted. Foot type and digits show no abnormalities.palpable pain noted on the shaft of the second and third metatarsals of the left foot.  Mild swelling noted on the dorsum of the second and third metatarsals, left foot. Skin: No Porokeratosis. No infection or ulcers  Diagnosis:  Onychomycosis, , Pain in right toe, pain in left toes.  Closed metatarsal  Fracture left foot.  Treatment & Plan Procedures and Treatment: Consent by patient was obtained for treatment procedures.   Debridement of mycotic and hypertrophic toenails, 1 through 5 bilateral and clearing of subungual debris. No ulceration, no infection noted.  X-rays were taken of her left foot and they revealed no evidence of any bony pathology.  I discussed this with the  patient and told her it may take up to 4 weeks before any radiographic changes occur.  In the meantime, I recommended an anklet to be worn to limit her swelling and a surgical shoe to be worn to help immobilize and allow healing of her foot.  RTC 3-4 weeks for foot pain. Return Visit-Office Procedure: Patient instructed to return to the office for a follow up visit 3 months for preventative foot care services.    Gardiner Barefoot DPM

## 2017-05-02 ENCOUNTER — Ambulatory Visit (INDEPENDENT_AMBULATORY_CARE_PROVIDER_SITE_OTHER): Payer: Medicare Other

## 2017-05-02 ENCOUNTER — Ambulatory Visit: Payer: Medicare Other | Admitting: Podiatry

## 2017-05-02 ENCOUNTER — Encounter: Payer: Self-pay | Admitting: Podiatry

## 2017-05-02 DIAGNOSIS — S92302A Fracture of unspecified metatarsal bone(s), left foot, initial encounter for closed fracture: Secondary | ICD-10-CM

## 2017-05-02 DIAGNOSIS — S93602D Unspecified sprain of left foot, subsequent encounter: Secondary | ICD-10-CM

## 2017-05-02 DIAGNOSIS — S92302D Fracture of unspecified metatarsal bone(s), left foot, subsequent encounter for fracture with routine healing: Secondary | ICD-10-CM | POA: Diagnosis not present

## 2017-05-02 DIAGNOSIS — R52 Pain, unspecified: Secondary | ICD-10-CM

## 2017-05-02 NOTE — Progress Notes (Signed)
Complaint:  Visit Type: Patient returns to my office for continued evaluation of her left forefoot.  Patient was diagnosed with a possible stress fracture versus a foot sprain left foot.  Patient does remember falling in the ice in December and hurting her hip, which may have caused her to walk and aggravated her left forefoot.  She says her pain level is minimal and she feels she is 90% better.  She returns to the office for an evaluation and treatment  Podiatric Exam: Vascular: dorsalis pedis and posterior tibial pulses are palpable bilateral. Capillary return is immediate. Temperature gradient is WNL. Skin turgor WNL  Sensorium: Normal Semmes Weinstein monofilament test. Normal tactile sensation bilaterally. Nail Exam: Pt has thick disfigured discolored nails with subungual debris noted bilateral entire nail hallux through fifth toenails Ulcer Exam: There is no evidence of ulcer or pre-ulcerative changes or infection. Orthopedic Exam: Muscle tone and strength are WNL. No limitations in general ROM. No crepitus or effusions noted. Foot type and digits show no abnormalities.palpable pain noted on the shaft of the second and third metatarsals of the left foot.  Minimal  swelling noted on the dorsum of the second and third metatarsals, left foot..  Minimal palpable pain noted. Skin: No Porokeratosis. No infection or ulcers  Diagnosis:  Closed metatarsal  Fracture left foot.  Foot Sprain left foot. Treatment & Plan Procedures and Treatment:   X-rays were taken of her left foot and they revealed no evidence of any bony pathology.   Patient has no evidence of any pain or swelling noted in the left foot.  I told her to discontinue usage of the compression sock and to return to her regular footgear.  If pain persists, should use her surgical shoe  .  RTC prn Return Visit-Office Procedure: Patient instructed to return  To the office prn.    Gardiner Barefoot DPM

## 2017-05-03 ENCOUNTER — Other Ambulatory Visit: Payer: Self-pay | Admitting: Podiatry

## 2017-05-03 DIAGNOSIS — S92302D Fracture of unspecified metatarsal bone(s), left foot, subsequent encounter for fracture with routine healing: Secondary | ICD-10-CM

## 2017-09-14 ENCOUNTER — Other Ambulatory Visit: Payer: Self-pay | Admitting: Obstetrics & Gynecology

## 2017-09-14 DIAGNOSIS — Z1231 Encounter for screening mammogram for malignant neoplasm of breast: Secondary | ICD-10-CM

## 2017-10-11 ENCOUNTER — Encounter: Payer: Self-pay | Admitting: Obstetrics & Gynecology

## 2017-10-11 ENCOUNTER — Ambulatory Visit (INDEPENDENT_AMBULATORY_CARE_PROVIDER_SITE_OTHER): Payer: Medicare Other | Admitting: Obstetrics & Gynecology

## 2017-10-11 DIAGNOSIS — Z1211 Encounter for screening for malignant neoplasm of colon: Secondary | ICD-10-CM

## 2017-10-11 DIAGNOSIS — Z0000 Encounter for general adult medical examination without abnormal findings: Secondary | ICD-10-CM | POA: Diagnosis not present

## 2017-10-11 NOTE — Patient Instructions (Signed)
PAP every 5 years Mammogram every year Colonoscopy every 10 years Labs yearly (with PCP) 

## 2017-10-11 NOTE — Progress Notes (Signed)
HPI:      [name] is a 67 y.o. G0P0000 who LMP was in the past, she presents today for her annual examination.  The patient has no complaints today. The patient is not sexually active. Herlast pap: approximate date 2017 and was normal and last mammogram: approximate date 2018 and was normal.  The patient does perform self breast exams.  There is no notable family history of breast or ovarian cancer in her family. The patient is not taking hormone replacement therapy. Patient denies post-menopausal vaginal bleeding.   The patient has regular exercise: yes. The patient denies current symptoms of depression.    GYN Hx: Last Colonoscopy:2 years ago. Normal.  Last DEXA: 2 years ago.    PMHx: Past Medical History:  Diagnosis Date  . Anxiety   . Diabetes ([place])   . HBP (high blood pressure)   . Hyperlipidemia   . Hypertension   . Pancreatitis 2005  . Skin cancer   . Swelling    Past Surgical History:  Procedure Laterality Date  . BREAST BIOPSY Right   . BREAST CYST ASPIRATION Left 2015  . BREAST SURGERY Left 09/05/2013   FNA: Comments: LEFT BREAST  . BREAST SURGERY Right 1990  . CHOLECYSTECTOMY  2005  . COLONOSCOPY  2007   DR. Byrnett, normal study  . COLONOSCOPY WITH PROPOFOL N/A 11/10/2015   Procedure: COLONOSCOPY WITH PROPOFOL;  Surgeon: Valerie Bellow, MD;  Location: [facility] ENDOSCOPY;  Service: Endoscopy;  Laterality: N/A;  . VAGINAL HYSTERECTOMY  2005   Family History  Problem Relation Age of Onset  . Bladder Cancer Father   . Breast cancer Paternal Aunt   . Leukemia Maternal Aunt   . Bladder Cancer Maternal Uncle    Social History   Tobacco Use  . Smoking status: Never Smoker  . Smokeless tobacco: Never Used  Substance Use Topics  . Alcohol use: No  . Drug use: No    Current Outpatient Medications:  .  ALPRAZolam (XANAX) 0.25 MG tablet, Take by mouth at bedtime as needed. Reported on 10/01/2015, Disp: , Rfl:  .  aspirin EC 81 MG tablet, Take 81 mg by  mouth daily. , Disp: , Rfl:  .  atorvastatin (LIPITOR) 20 MG tablet, Take 20 mg by mouth daily. , Disp: , Rfl:  .  Calcium Carb-Cholecalciferol (CALCIUM 600 + D PO), Take 1 tablet by mouth daily. , Disp: , Rfl:  .  calcium-vitamin D (OSCAL WITH D) 500-200 MG-UNIT TABS tablet, Take by mouth., Disp: , Rfl:  .  Cholecalciferol (VITAMIN D-3 PO), Take 1 tablet by mouth daily. , Disp: , Rfl:  .  clobetasol cream (TEMOVATE) 1.96 %, Apply 1 application topically 2 (two) times daily., Disp: , Rfl:  .  escitalopram (LEXAPRO) 10 MG tablet, Take 10 mg by mouth. Take 1 1/2 tabs daily, Disp: , Rfl:  .  glipiZIDE (GLUCOTROL) 10 MG tablet, Take 5 mg by mouth daily before breakfast. , Disp: , Rfl:  .  ketoconazole (NIZORAL) 2 % cream, APPLY ON THE SKIN DAILY AS NEEDED, Disp: , Rfl: 0 .  losartan-hydrochlorothiazide (HYZAAR) 50-12.5 MG tablet, Take 1 tablet by mouth daily. , Disp: , Rfl:  .  metFORMIN (GLUCOPHAGE-XR) 500 MG 24 hr tablet, , Disp: , Rfl: 0 .  mometasone (NASONEX) 50 MCG/ACT nasal spray, Place 2 sprays into the nose daily as needed. , Disp: , Rfl:  .  ONE TOUCH ULTRA TEST test strip, , Disp: , Rfl:  .  Probiotic Product (ALIGN PO), Take 1 tablet by mouth daily. , Disp: , Rfl:  .  SHINGRIX injection, , Disp: , Rfl: 0 Allergies: Patient has no known allergies.  Review of Systems  Constitutional: Negative for chills, fever and malaise/fatigue.  HENT: Negative for congestion, sinus pain and sore throat.   Eyes: Negative for blurred vision and pain.  Respiratory: Negative for cough and wheezing.   Cardiovascular: Negative for chest pain and leg swelling.  Gastrointestinal: Negative for abdominal pain, constipation, diarrhea, heartburn, nausea and vomiting.  Genitourinary: Negative for dysuria, frequency, hematuria and urgency.  Musculoskeletal: Negative for back pain, joint pain, myalgias and neck pain.  Skin: Negative for itching and rash.  Neurological: Negative for dizziness, tremors and  weakness.  Endo/Heme/Allergies: Does not bruise/bleed easily.  Psychiatric/Behavioral: Negative for depression. The patient is not nervous/anxious and does not have insomnia.     Objective: There were no vitals taken for this visit. There were no vitals filed for this visit. There is no height or weight on file to calculate BMI. Physical Exam  Constitutional: She is oriented to person, place, and time. She appears well-developed and well-nourished. No distress.  Genitourinary: Rectum normal and vagina normal. Pelvic exam was performed with patient supine. There is no rash or lesion on the right labia. There is no rash or lesion on the left labia. Vagina exhibits no lesion. No bleeding in the vagina. Right adnexum does not display mass and does not display tenderness. Left adnexum does not display mass and does not display tenderness.  Genitourinary Comments: Absent Uterus Absent cervix Vaginal cuff well healed  HENT:  Head: Normocephalic and atraumatic. Head is without laceration.  Right Ear: Hearing normal.  Left Ear: Hearing normal.  Nose: No epistaxis.  No foreign bodies.  Mouth/Throat: Uvula is midline, oropharynx is clear and moist and mucous membranes are normal.  Eyes: Pupils are equal, round, and reactive to light.  Neck: Normal range of motion. Neck supple. No thyromegaly present.  Cardiovascular: Normal rate and regular rhythm. Exam reveals no gallop and no friction rub.  No murmur heard. Pulmonary/Chest: Effort normal and breath sounds normal. No respiratory distress. She has no wheezes. Right breast exhibits no mass, no skin change and no tenderness. Left breast exhibits no mass, no skin change and no tenderness.  Abdominal: Soft. Bowel sounds are normal. She exhibits no distension. There is no tenderness. There is no rebound.  Musculoskeletal: Normal range of motion.  Neurological: She is alert and oriented to person, place, and time. No cranial nerve deficit.  Skin: Skin is  warm and dry.  Psychiatric: She has a normal mood and affect. Judgment normal.  Vitals reviewed.   Assessment: Annual Exam 1. Annual physical exam   2. Screen for colon cancer     Plan:            1.  Cervical Screening-  Pap smear schedule reviewed with patient  2. Breast screening- Exam annually and mammogram scheduled  3. Colonoscopy every 10 years, Hemoccult testing after age 30  4. Labs managed by PCP  5. Counseling for hormonal therapy: none     F/U  Return in about 1 year (around 10/12/2018) for Annual.  Barnett Applebaum, MD, [facility], [facility] 10/11/2017  10:26 AM

## 2017-10-26 ENCOUNTER — Ambulatory Visit
Admission: RE | Admit: 2017-10-26 | Discharge: 2017-10-26 | Disposition: A | Discharge status: Home / Self Care | Payer: Medicare Other | Source: Ambulatory Visit | Attending: Obstetrics & Gynecology | Admitting: Obstetrics & Gynecology

## 2017-10-26 DIAGNOSIS — Z1231 Encounter for screening mammogram for malignant neoplasm of breast: Secondary | ICD-10-CM | POA: Insufficient documentation

## 2017-10-28 LAB — FECAL OCCULT BLOOD, IMMUNOCHEMICAL: Fecal Occult Bld: NEGATIVE

## 2017-12-12 ENCOUNTER — Ambulatory Visit: Payer: Medicare Other | Admitting: Podiatry

## 2017-12-12 ENCOUNTER — Encounter: Payer: Self-pay | Admitting: Podiatry

## 2017-12-12 DIAGNOSIS — E119 Type 2 diabetes mellitus without complications: Secondary | ICD-10-CM

## 2017-12-12 DIAGNOSIS — M79676 Pain in unspecified toe(s): Secondary | ICD-10-CM | POA: Diagnosis not present

## 2017-12-12 DIAGNOSIS — B351 Tinea unguium: Secondary | ICD-10-CM | POA: Diagnosis not present

## 2017-12-12 DIAGNOSIS — M216X9 Other acquired deformities of unspecified foot: Secondary | ICD-10-CM

## 2017-12-12 NOTE — Progress Notes (Signed)
Complaint:  Visit Type: Patient returns to my office for continued preventative foot care services. Complaint: Patient states" my nails have grown long and thick and become painful to walk and wear shoes" Patient has been diagnosed with DM with no foot complications. The patient presents for preventative foot care services. No changes to ROS.  Patient also says she is experiencing pain in the forefoot both feet.    Podiatric Exam: Vascular: dorsalis pedis and posterior tibial pulses are palpable bilateral. Capillary return is immediate. Temperature gradient is WNL. Skin turgor WNL  Sensorium: Normal Semmes Weinstein monofilament test. Normal tactile sensation bilaterally. Nail Exam: Pt has thick disfigured discolored nails with subungual debris noted bilateral entire nail hallux through fifth toenails Ulcer Exam: There is no evidence of ulcer or pre-ulcerative changes or infection. Orthopedic Exam: Muscle tone and strength are WNL. No limitations in general ROM. No crepitus or effusions noted. Foot type and digits show no abnormalities. Hammer toes 2-4  B/L.  Plantar flex metatarsal  2-4  B/L. Skin: No Porokeratosis. No infection or ulcers  Diagnosis:  Onychomycosis, , Pain in right toe, pain in left toes  Treatment & Plan Procedures and Treatment: Consent by patient was obtained for treatment procedures.   Debridement of mycotic and hypertrophic toenails, 1 through 5 bilateral and clearing of subungual debris. No ulceration, no infection noted.  Discussed her painful forefeet with this patient.  She has worn  OTC insoles before but pain persists.  She is scheduled to meet with [facility] for soft orthoses. Return Visit-Office Procedure: Patient instructed to return to the office for a follow up visit 3 months for continued evaluation and treatment.    Gardiner Barefoot DPM

## 2017-12-21 ENCOUNTER — Other Ambulatory Visit: Payer: Medicare Other | Admitting: Orthotics

## 2018-02-08 ENCOUNTER — Ambulatory Visit: Payer: Medicare Other | Admitting: Orthotics

## 2018-02-08 DIAGNOSIS — M2042 Other hammer toe(s) (acquired), left foot: Secondary | ICD-10-CM

## 2018-02-08 DIAGNOSIS — B351 Tinea unguium: Secondary | ICD-10-CM

## 2018-02-08 DIAGNOSIS — M216X9 Other acquired deformities of unspecified foot: Secondary | ICD-10-CM

## 2018-02-08 DIAGNOSIS — E119 Type 2 diabetes mellitus without complications: Secondary | ICD-10-CM

## 2018-02-08 DIAGNOSIS — L84 Corns and callosities: Secondary | ICD-10-CM

## 2018-02-08 DIAGNOSIS — M79676 Pain in unspecified toe(s): Principal | ICD-10-CM

## 2018-02-08 NOTE — Progress Notes (Signed)

## 2018-03-15 ENCOUNTER — Ambulatory Visit (INDEPENDENT_AMBULATORY_CARE_PROVIDER_SITE_OTHER): Payer: Self-pay | Admitting: Orthotics

## 2018-03-15 DIAGNOSIS — E119 Type 2 diabetes mellitus without complications: Secondary | ICD-10-CM

## 2018-03-15 DIAGNOSIS — M216X9 Other acquired deformities of unspecified foot: Secondary | ICD-10-CM

## 2018-03-15 DIAGNOSIS — L84 Corns and callosities: Secondary | ICD-10-CM | POA: Diagnosis not present

## 2018-03-15 DIAGNOSIS — M2042 Other hammer toe(s) (acquired), left foot: Secondary | ICD-10-CM

## 2018-03-15 DIAGNOSIS — M79676 Pain in unspecified toe(s): Secondary | ICD-10-CM

## 2018-03-15 DIAGNOSIS — B351 Tinea unguium: Secondary | ICD-10-CM

## 2018-03-15 NOTE — Progress Notes (Signed)

## 2018-09-02 ENCOUNTER — Emergency Department
Admission: EM | Admit: 2018-09-02 | Discharge: 2018-09-02 | Disposition: A | Discharge status: Home / Self Care | Payer: Medicare Other | Attending: Student in an Organized Health Care Education/Training Program | Admitting: Student in an Organized Health Care Education/Training Program

## 2018-09-02 ENCOUNTER — Other Ambulatory Visit: Payer: Self-pay

## 2018-09-02 ENCOUNTER — Emergency Department: Payer: Medicare Other

## 2018-09-02 DIAGNOSIS — Z7982 Long term (current) use of aspirin: Secondary | ICD-10-CM | POA: Insufficient documentation

## 2018-09-02 DIAGNOSIS — R1031 Right lower quadrant pain: Secondary | ICD-10-CM | POA: Insufficient documentation

## 2018-09-02 DIAGNOSIS — Z79899 Other long term (current) drug therapy: Secondary | ICD-10-CM | POA: Insufficient documentation

## 2018-09-02 DIAGNOSIS — I10 Essential (primary) hypertension: Secondary | ICD-10-CM | POA: Diagnosis not present

## 2018-09-02 DIAGNOSIS — Z7984 Long term (current) use of oral hypoglycemic drugs: Secondary | ICD-10-CM | POA: Insufficient documentation

## 2018-09-02 DIAGNOSIS — Z85828 Personal history of other malignant neoplasm of skin: Secondary | ICD-10-CM | POA: Diagnosis not present

## 2018-09-02 DIAGNOSIS — E119 Type 2 diabetes mellitus without complications: Secondary | ICD-10-CM | POA: Diagnosis not present

## 2018-09-02 LAB — URINALYSIS, COMPLETE (UACMP) WITH MICROSCOPIC
Bacteria, UA: NONE SEEN
Bilirubin Urine: NEGATIVE
Glucose, UA: NEGATIVE mg/dL
Hgb urine dipstick: NEGATIVE
Ketones, ur: NEGATIVE mg/dL
Leukocytes,Ua: NEGATIVE
Nitrite: NEGATIVE
Protein, ur: NEGATIVE mg/dL
Specific Gravity, Urine: 1.024 (ref 1.005–1.030)
pH: 5 (ref 5.0–8.0)

## 2018-09-02 LAB — CBC WITH DIFFERENTIAL/PLATELET
Abs Immature Granulocytes: 0.04 10*3/uL (ref 0.00–0.07)
Basophils Absolute: 0.1 10*3/uL (ref 0.0–0.1)
Basophils Relative: 1 %
Eosinophils Absolute: 0.1 10*3/uL (ref 0.0–0.5)
Eosinophils Relative: 1 %
HCT: 36.8 % (ref 36.0–46.0)
Hemoglobin: 12.4 g/dL (ref 12.0–15.0)
Immature Granulocytes: 0 %
Lymphocytes Relative: 18 %
Lymphs Abs: 1.8 10*3/uL (ref 0.7–4.0)
MCH: 30.7 pg (ref 26.0–34.0)
MCHC: 33.7 g/dL (ref 30.0–36.0)
MCV: 91.1 fL (ref 80.0–100.0)
Monocytes Absolute: 1 10*3/uL (ref 0.1–1.0)
Monocytes Relative: 10 %
Neutro Abs: 7.3 10*3/uL (ref 1.7–7.7)
Neutrophils Relative %: 70 %
Platelets: 281 10*3/uL (ref 150–400)
RBC: 4.04 MIL/uL (ref 3.87–5.11)
RDW: 12.1 % (ref 11.5–15.5)
WBC: 10.1 10*3/uL (ref 4.0–10.5)
nRBC: 0 % (ref 0.0–0.2)

## 2018-09-02 LAB — COMPREHENSIVE METABOLIC PANEL
ALT: 10 U/L (ref 0–44)
AST: 21 U/L (ref 15–41)
Albumin: 4.2 g/dL (ref 3.5–5.0)
Alkaline Phosphatase: 83 U/L (ref 38–126)
Anion gap: 12 (ref 5–15)
BUN: 29 mg/dL — ABNORMAL HIGH (ref 8–23)
CO2: 25 mmol/L (ref 22–32)
Calcium: 9.3 mg/dL (ref 8.9–10.3)
Chloride: 96 mmol/L — ABNORMAL LOW (ref 98–111)
Creatinine, Ser: 1.24 mg/dL — ABNORMAL HIGH (ref 0.44–1.00)
GFR calc Af Amer: 51 mL/min — ABNORMAL LOW (ref >60)
GFR calc non Af Amer: 44 mL/min — ABNORMAL LOW (ref >60)
Glucose, Bld: 245 mg/dL — ABNORMAL HIGH (ref 70–99)
Potassium: 3.7 mmol/L (ref 3.5–5.1)
Sodium: 133 mmol/L — ABNORMAL LOW (ref 135–145)
Total Bilirubin: 1.4 mg/dL — ABNORMAL HIGH (ref 0.3–1.2)
Total Protein: 7.5 g/dL (ref 6.5–8.1)

## 2018-09-02 LAB — LIPASE, BLOOD: Lipase: 31 U/L (ref 11–51)

## 2018-09-02 MED ORDER — TRAMADOL HCL 50 MG PO TABS: 50.0000 mg | ORAL_TABLET | Freq: Four times a day (QID) | ORAL | 0 refills | Status: AC | PRN

## 2018-09-02 MED ORDER — SODIUM CHLORIDE 0.9 % IV BOLUS
500.0000 mL | Freq: Once | INTRAVENOUS | Status: AC
  Administered 2018-09-02: 500 mL via INTRAVENOUS

## 2018-09-02 MED ORDER — IOHEXOL 300 MG/ML  SOLN
80.0000 mL | Freq: Once | INTRAMUSCULAR | Status: AC | PRN
  Administered 2018-09-02: 80 mL via INTRAVENOUS

## 2018-09-02 NOTE — ED Notes (Signed)
Pt in CT.

## 2018-09-02 NOTE — ED Notes (Signed)
Pt back to room.

## 2018-09-02 NOTE — ED Triage Notes (Signed)
Pt c/o RLQ pain since Thursday afternoon. Denies N/V/D/fever.Marland Kitchen

## 2018-09-02 NOTE — ED Notes (Signed)
EDP at bedside  

## 2018-09-02 NOTE — ED Notes (Addendum)
Pt complains of constant RLQ pain since Thursday afternoon- denies n/v/d- denies being around anyone sick

## 2018-09-02 NOTE — ED Notes (Signed)
Signature pad in room not working- discharge printed and signed by pt

## 2018-09-02 NOTE — ED Notes (Signed)
Pt to CT

## 2018-09-02 NOTE — Discharge Instructions (Addendum)

## 2018-09-02 NOTE — ED Provider Notes (Signed)
[facility] Emergency Department Provider Note    First MD Initiated Contact with Patient 09/02/18 1159     (approximate)  I have reviewed the triage vital signs and the nursing notes.   HISTORY  Chief Complaint Abdominal Pain    HPI [name] is a 68 y.o. female below listed past medical history presents the ER for 1 week of right lower quadrant pain that is been persistently worsening.  States that she did have fevers and chills several days ago.  States it is worse with movement and walking.  Denies any diarrhea.  No dysuria.  Does still have her appendix.  Pain is mild to moderate in severity.    Past Medical History:  Diagnosis Date  . Anxiety   . Diabetes ([place])   . HBP (high blood pressure)   . Hyperlipidemia   . Hypertension   . Pancreatitis 2005  . Skin cancer   . Swelling    Family History  Problem Relation Age of Onset  . Bladder Cancer Father   . Breast cancer Paternal Aunt   . Leukemia Maternal Aunt   . Bladder Cancer Maternal Uncle    Past Surgical History:  Procedure Laterality Date  . BREAST BIOPSY Right   . BREAST CYST ASPIRATION Left 2015  . BREAST SURGERY Left 09/05/2013   FNA: Comments: LEFT BREAST  . BREAST SURGERY Right 1990  . CHOLECYSTECTOMY  2005  . COLONOSCOPY  2007   DR. Byrnett, normal study  . COLONOSCOPY WITH PROPOFOL N/A 11/10/2015   Procedure: COLONOSCOPY WITH PROPOFOL;  Surgeon: Robert Bellow, MD;  Location: [facility] ENDOSCOPY;  Service: Endoscopy;  Laterality: N/A;  . VAGINAL HYSTERECTOMY  2005   Patient Active Problem List   Diagnosis Date Noted  . Screening for breast cancer 09/28/2016  . Encounter for screening colonoscopy 10/01/2015  . 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 Anxiety 11/27/2013  . Controlled type 2 diabetes mellitus without complication ([place]) 21/30/8657  . Benign essential hypertension 11/27/2013  . Pure hypercholesterolemia 11/27/2013  . Lump or mass in breast 09/07/2013       Prior to Admission medications   Medication Sig Start Date End Date Taking? Authorizing Provider  ALPRAZolam Duanne Moron) 0.25 MG tablet Take by mouth at bedtime as needed. Reported on 10/01/2015 01/06/15   [provider]  aspirin EC 81 MG tablet Take 81 mg by mouth daily.     [provider]  atorvastatin (LIPITOR) 20 MG tablet Take 20 mg by mouth daily.  07/08/14   [provider]  Calcium Carb-Cholecalciferol (CALCIUM 600 + D PO) Take 1 tablet by mouth daily.     [provider]  calcium-vitamin D (OSCAL WITH D) 500-200 MG-UNIT TABS tablet Take by mouth.    [provider]  Cholecalciferol (VITAMIN D-3 PO) Take 1 tablet by mouth daily.     [provider]  clobetasol cream (TEMOVATE) 8.46 % Apply 1 application topically 2 (two) times daily.    [provider]  escitalopram (LEXAPRO) 10 MG tablet Take 10 mg by mouth. Take 1 1/2 tabs daily 07/08/14   [provider]  glipiZIDE (GLUCOTROL) 10 MG tablet Take 5 mg by mouth daily before breakfast.  07/08/14   [provider]  ketoconazole (NIZORAL) 2 % cream APPLY ON THE SKIN DAILY AS NEEDED 12/07/16   [provider]  losartan-hydrochlorothiazide (HYZAAR) 50-12.5 MG tablet Take 1 tablet by mouth daily.  07/08/14   [provider]  metFORMIN (GLUCOPHAGE-XR) 500 MG 24 hr tablet  12/17/16   [provider]  mometasone (NASONEX) 50 MCG/ACT nasal spray Place 2 sprays into the nose daily as needed.  01/06/15   [provider]  ONE TOUCH ULTRA TEST test strip  12/20/12   [provider]  Probiotic Product (ALIGN PO) Take 1 tablet by mouth daily.     [provider]  [facility] injection  12/14/16   [provider]  traMADol (ULTRAM) 50 MG tablet Take 1 tablet (50 mg total) by mouth every 6 (six) hours as needed. 09/02/18 09/02/19  Merlyn Lot, MD    Allergies Patient has no known allergies.    Social History Social  History   Tobacco Use  . Smoking status: Never Smoker  . Smokeless tobacco: Never Used  Substance Use Topics  . Alcohol use: No  . Drug use: No    Review of Systems Patient denies headaches, rhinorrhea, blurry vision, numbness, shortness of breath, chest pain, edema, cough, abdominal pain, nausea, vomiting, diarrhea, dysuria, fevers, rashes or hallucinations unless otherwise stated above in HPI. ____________________________________________   PHYSICAL EXAM:  VITAL SIGNS: Vitals:   09/02/18 1330 09/02/18 1400  BP: 122/69 123/73  Pulse: 70 64  Resp:    Temp:    SpO2: 100% 96%    Constitutional: Alert and oriented.  Eyes: Conjunctivae are normal.  Head: Atraumatic. Nose: No congestion/rhinnorhea. Mouth/Throat: Mucous membranes are moist.   Neck: No stridor. Painless ROM.  Cardiovascular: Normal rate, regular rhythm. Grossly normal heart sounds.  Good peripheral circulation. Respiratory: Normal respiratory effort.  No retractions. Lungs CTAB. Gastrointestinal: Soft with ttp in rlq.  No overlying vesicular lesions. No distention. No abdominal bruits. No CVA tenderness. Genitourinary: defered Musculoskeletal: No lower extremity tenderness nor edema.  No joint effusions. Neurologic:  Normal speech and language. No gross focal neurologic deficits are appreciated. No facial droop Skin:  Skin is warm, dry and intact. No rash noted. Psychiatric: Mood and affect are normal. Speech and behavior are normal.  ____________________________________________   LABS (all labs ordered are listed, but only abnormal results are displayed)  Results for orders placed or performed during the hospital encounter of 09/02/18 (from the past 24 hour(s))  Urinalysis, Complete w Microscopic     Status: Abnormal   Collection Time: 09/02/18 12:00 PM  Result Value Ref Range   Color, Urine STRAW (A) YELLOW   APPearance CLEAR (A) CLEAR   Specific Gravity, Urine 1.024 1.005 - 1.030   pH 5.0 5.0 - 8.0    Glucose, UA NEGATIVE NEGATIVE mg/dL   Hgb urine dipstick NEGATIVE NEGATIVE   Bilirubin Urine NEGATIVE NEGATIVE   Ketones, ur NEGATIVE NEGATIVE mg/dL   Protein, ur NEGATIVE NEGATIVE mg/dL   Nitrite NEGATIVE NEGATIVE   Leukocytes,Ua NEGATIVE NEGATIVE   WBC, UA 0-5 0 - 5 WBC/hpf   Bacteria, UA NONE SEEN NONE SEEN   Squamous Epithelial / LPF 0-5 0 - 5  CBC with Differential/Platelet     Status: None   Collection Time: 09/02/18 12:09 PM  Result Value Ref Range   WBC 10.1 4.0 - 10.5 K/uL   RBC 4.04 3.87 - 5.11 MIL/uL   Hemoglobin 12.4 12.0 - 15.0 g/dL   HCT 36.8 36.0 - 46.0 %   MCV 91.1 80.0 - 100.0 fL   MCH 30.7 26.0 - 34.0 pg   MCHC 33.7 30.0 - 36.0 g/dL   RDW 12.1 11.5 - 15.5 %   Platelets 281 150 - 400 K/uL  nRBC 0.0 0.0 - 0.2 %   Neutrophils Relative % 70 %   Neutro Abs 7.3 1.7 - 7.7 K/uL   Lymphocytes Relative 18 %   Lymphs Abs 1.8 0.7 - 4.0 K/uL   Monocytes Relative 10 %   Monocytes Absolute 1.0 0.1 - 1.0 K/uL   Eosinophils Relative 1 %   Eosinophils Absolute 0.1 0.0 - 0.5 K/uL   Basophils Relative 1 %   Basophils Absolute 0.1 0.0 - 0.1 K/uL   Immature Granulocytes 0 %   Abs Immature Granulocytes 0.04 0.00 - 0.07 K/uL  Comprehensive metabolic panel     Status: Abnormal   Collection Time: 09/02/18 12:09 PM  Result Value Ref Range   Sodium 133 (L) 135 - 145 mmol/L   Potassium 3.7 3.5 - 5.1 mmol/L   Chloride 96 (L) 98 - 111 mmol/L   CO2 25 22 - 32 mmol/L   Glucose, Bld 245 (H) 70 - 99 mg/dL   BUN 29 (H) 8 - 23 mg/dL   Creatinine, Ser 1.24 (H) 0.44 - 1.00 mg/dL   Calcium 9.3 8.9 - 10.3 mg/dL   Total Protein 7.5 6.5 - 8.1 g/dL   Albumin 4.2 3.5 - 5.0 g/dL   AST 21 15 - 41 U/L   ALT 10 0 - 44 U/L   Alkaline Phosphatase 83 38 - 126 U/L   Total Bilirubin 1.4 (H) 0.3 - 1.2 mg/dL   GFR calc non Af Amer 44 (L) >60 mL/min   GFR calc Af Amer 51 (L) >60 mL/min   Anion gap 12 5 - 15  Lipase, blood     Status: None   Collection Time: 09/02/18 12:09 PM  Result Value Ref  Range   Lipase 31 11 - 51 U/L   ____________________________________________  EKG___________________________________  RADIOLOGY  I personally reviewed all radiographic images ordered to evaluate for the above acute complaints and reviewed radiology reports and findings.  These findings were personally discussed with the patient.  Please see medical record for radiology report.  ____________________________________________   PROCEDURES  Procedure(s) performed:  Procedures    Critical Care performed: no ____________________________________________   INITIAL IMPRESSION / ASSESSMENT AND PLAN / ED COURSE  Pertinent labs & imaging results that were available during my care of the patient were reviewed by me and considered in my medical decision making (see chart for details).   DDX: appy, hernia, stone, cystitis, shingles, msk strain  [employer] Walsworth is a 68 y.o. who presents to the ED with right lower quadrant abdominal pain as described above.  Patient well-appearing she is afebrile pain is been ongoing for several days.  Given her age and risk factors will order CT imaging to evaluate for the by differential.  T imaging ordered shows no evidence of acute appendicitis hernia, stone or hydronephrosis.  Does have diverticulosis without diverticulitis.  I will see any clinical evidence of shingles.  Possible musculoskeletal strain.  Will check urinalysis.  Clinical Course as of Sep 02 1510  Sat Sep 02, 2018  1509 Urinalysis is negative.  Blood work and exam is reassuring.  Probable some component of musculoskeletal strain.  Patient stable and appropriate for outpatient follow-up.  We discussed strict return precautions.  Have discussed with the patient and available family all diagnostics and treatments performed thus far and all questions were answered to the best of my ability. The patient demonstrates understanding and agreement with plan.    [PR]    Clinical Course User Index  [PR] Quentin Cornwall,  Saralyn Pilar, MD    The patient was evaluated in Emergency Department today for the symptoms described in the history of present illness. He/she was evaluated in the context of the global COVID-19 pandemic, which necessitated consideration that the patient might be at risk for infection with the SARS-CoV-2 virus that causes COVID-19. Institutional protocols and algorithms that pertain to the evaluation of patients at risk for COVID-19 are in a state of rapid change based on information released by regulatory bodies including the CDC and federal and state organizations. These policies and algorithms were followed during the patient's care in the ED.  As part of my medical decision making, I reviewed the following data within the [place] notes reviewed and incorporated, Labs reviewed, notes from prior ED visits and [place] Controlled Substance Database   ____________________________________________   FINAL CLINICAL IMPRESSION(S) / ED DIAGNOSES  Final diagnoses:  Right lower quadrant abdominal pain      NEW MEDICATIONS STARTED DURING THIS VISIT:  New Prescriptions   TRAMADOL (ULTRAM) 50 MG TABLET    Take 1 tablet (50 mg total) by mouth every 6 (six) hours as needed.     Note:  This document was prepared using Dragon voice recognition software and may include unintentional dictation errors.    Merlyn Lot, MD 09/02/18 [phone]

## 2018-09-02 NOTE — ED Notes (Signed)
Pt ambulatory to toilet to collect urine sample.  

## 2018-09-07 ENCOUNTER — Encounter: Payer: Self-pay | Admitting: *Deleted

## 2018-09-07 ENCOUNTER — Other Ambulatory Visit: Payer: Self-pay

## 2018-09-07 NOTE — Discharge Instructions (Signed)

## 2018-09-08 ENCOUNTER — Other Ambulatory Visit
Admission: RE | Admit: 2018-09-08 | Discharge: 2018-09-08 | Disposition: A | Discharge status: Home / Self Care | Payer: Medicare Other | Source: Ambulatory Visit | Attending: Ophthalmology | Admitting: Ophthalmology

## 2018-09-08 ENCOUNTER — Other Ambulatory Visit: Payer: Self-pay

## 2018-09-08 DIAGNOSIS — Z01812 Encounter for preprocedural laboratory examination: Secondary | ICD-10-CM | POA: Diagnosis present

## 2018-09-08 DIAGNOSIS — Z1159 Encounter for screening for other viral diseases: Secondary | ICD-10-CM | POA: Diagnosis not present

## 2018-09-09 LAB — NOVEL CORONAVIRUS, NAA (HOSP ORDER, SEND-OUT TO REF LAB; TAT 18-24 HRS): SARS-CoV-2, NAA: NOT DETECTED

## 2018-09-13 ENCOUNTER — Ambulatory Visit: Payer: Medicare Other | Admitting: Anesthesiology

## 2018-09-13 ENCOUNTER — Other Ambulatory Visit: Payer: Self-pay

## 2018-09-13 ENCOUNTER — Encounter
Admission: RE | Disposition: A | Discharge status: Home / Self Care | Payer: Self-pay | Source: Home / Self Care | Attending: Ophthalmology

## 2018-09-13 ENCOUNTER — Ambulatory Visit
Admission: RE | Admit: 2018-09-13 | Discharge: 2018-09-13 | Disposition: A | Discharge status: Home / Self Care | Payer: Medicare Other | Attending: Ophthalmology | Admitting: Ophthalmology

## 2018-09-13 DIAGNOSIS — N183 Chronic kidney disease, stage 3 (moderate): Secondary | ICD-10-CM | POA: Insufficient documentation

## 2018-09-13 DIAGNOSIS — E1122 Type 2 diabetes mellitus with diabetic chronic kidney disease: Secondary | ICD-10-CM | POA: Insufficient documentation

## 2018-09-13 DIAGNOSIS — M8580 Other specified disorders of bone density and structure, unspecified site: Secondary | ICD-10-CM | POA: Insufficient documentation

## 2018-09-13 DIAGNOSIS — H2512 Age-related nuclear cataract, left eye: Secondary | ICD-10-CM | POA: Diagnosis present

## 2018-09-13 DIAGNOSIS — I129 Hypertensive chronic kidney disease with stage 1 through stage 4 chronic kidney disease, or unspecified chronic kidney disease: Secondary | ICD-10-CM | POA: Diagnosis not present

## 2018-09-13 DIAGNOSIS — E7800 Pure hypercholesterolemia, unspecified: Secondary | ICD-10-CM | POA: Diagnosis not present

## 2018-09-13 DIAGNOSIS — Z7984 Long term (current) use of oral hypoglycemic drugs: Secondary | ICD-10-CM | POA: Diagnosis not present

## 2018-09-13 DIAGNOSIS — F419 Anxiety disorder, unspecified: Secondary | ICD-10-CM | POA: Diagnosis not present

## 2018-09-13 HISTORY — PX: CATARACT EXTRACTION W/PHACO: SHX586

## 2018-09-13 LAB — GLUCOSE, CAPILLARY
Glucose-Capillary: 196 mg/dL — ABNORMAL HIGH (ref 70–99)
Glucose-Capillary: 145 mg/dL — ABNORMAL HIGH (ref 70–99)

## 2018-09-13 SURGERY — PHACOEMULSIFICATION, CATARACT, WITH IOL INSERTION
Anesthesia: Monitor Anesthesia Care | Site: Eye | Laterality: Left

## 2018-09-13 MED ORDER — ARMC OPHTHALMIC DILATING DROPS
1.0000 "application " | OPHTHALMIC | Status: DC | PRN
  Administered 2018-09-13 (×3): 1 via OPHTHALMIC

## 2018-09-13 MED ORDER — NA HYALUR & NA CHOND-NA HYALUR 0.4-0.35 ML IO KIT
PACK | INTRAOCULAR | Status: DC | PRN
  Administered 2018-09-13: 1 mL via INTRAOCULAR

## 2018-09-13 MED ORDER — LIDOCAINE HCL (PF) 2 % IJ SOLN
INTRAOCULAR | Status: DC | PRN
  Administered 2018-09-13: 1 mL

## 2018-09-13 MED ORDER — MIDAZOLAM HCL 2 MG/2ML IJ SOLN
INTRAMUSCULAR | Status: DC | PRN
  Administered 2018-09-13: 1 mg via INTRAVENOUS

## 2018-09-13 MED ORDER — CEFUROXIME OPHTHALMIC INJECTION 1 MG/0.1 ML
INJECTION | OPHTHALMIC | Status: DC | PRN
  Administered 2018-09-13: 0.1 mL via INTRACAMERAL

## 2018-09-13 MED ORDER — BRIMONIDINE TARTRATE-TIMOLOL 0.2-0.5 % OP SOLN
OPHTHALMIC | Status: DC | PRN
  Administered 2018-09-13: 1 [drp] via OPHTHALMIC

## 2018-09-13 MED ORDER — EPINEPHRINE PF 1 MG/ML IJ SOLN
INTRAOCULAR | Status: DC | PRN
  Administered 2018-09-13: 74 mL via OPHTHALMIC

## 2018-09-13 MED ORDER — FENTANYL CITRATE (PF) 100 MCG/2ML IJ SOLN
INTRAMUSCULAR | Status: DC | PRN
  Administered 2018-09-13: 50 ug via INTRAVENOUS

## 2018-09-13 MED ORDER — TETRACAINE HCL 0.5 % OP SOLN
1.0000 [drp] | OPHTHALMIC | Status: DC | PRN
  Administered 2018-09-13 (×3): 1 [drp] via OPHTHALMIC

## 2018-09-13 MED ORDER — MOXIFLOXACIN HCL 0.5 % OP SOLN
1.0000 [drp] | OPHTHALMIC | Status: DC | PRN
  Administered 2018-09-13 (×2): 1 [drp] via OPHTHALMIC

## 2018-09-13 SURGICAL SUPPLY — 20 items
ACRYSOF IQ PANOPTIX IOL (Intraocular Lens) ×2 IMPLANT
CANNULA ANT/CHMB 27G (MISCELLANEOUS) ×1 IMPLANT
CANNULA ANT/CHMB 27GA (MISCELLANEOUS) ×3 IMPLANT
GLOVE SURG LX 7.5 STRW (GLOVE) ×2
GLOVE SURG LX STRL 7.5 STRW (GLOVE) ×1 IMPLANT
GLOVE SURG TRIUMPH 8.0 PF LTX (GLOVE) ×3 IMPLANT
GOWN STRL REUS W/ TWL LRG LVL3 (GOWN DISPOSABLE) ×2 IMPLANT
GOWN STRL REUS W/TWL LRG LVL3 (GOWN DISPOSABLE) ×4
MARKER SKIN DUAL TIP RULER LAB (MISCELLANEOUS) ×3 IMPLANT
NDL FILTER BLUNT 18X1 1/2 (NEEDLE) ×1 IMPLANT
NEEDLE FILTER BLUNT 18X 1/2SAF (NEEDLE) ×2
NEEDLE FILTER BLUNT 18X1 1/2 (NEEDLE) ×1 IMPLANT
PACK CATARACT BRASINGTON (MISCELLANEOUS) ×3 IMPLANT
PACK EYE AFTER SURG (MISCELLANEOUS) ×3 IMPLANT
PACK OPTHALMIC (MISCELLANEOUS) ×3 IMPLANT
SYR 3ML LL SCALE MARK (SYRINGE) ×3 IMPLANT
SYR 5ML LL (SYRINGE) ×3 IMPLANT
SYR TB 1ML LUER SLIP (SYRINGE) ×3 IMPLANT
WATER STERILE IRR 500ML POUR (IV SOLUTION) ×3 IMPLANT
WIPE NON LINTING 3.25X3.25 (MISCELLANEOUS) ×3 IMPLANT

## 2018-09-13 NOTE — H&P (Signed)

## 2018-09-13 NOTE — Op Note (Signed)
OPERATIVE NOTE  Valerie Farmer [id number] 09/13/2018   PREOPERATIVE DIAGNOSIS:  Nuclear sclerotic cataract left eye. H25.12   POSTOPERATIVE DIAGNOSIS:    Nuclear sclerotic cataract left eye.     PROCEDURE:  Phacoemusification with posterior chamber intraocular lens placement of the left eye   LENS:   Implant Name Type Inv. Item Serial No. Manufacturer Lot No. LRB No. Used Action  ACRYSOF IQ PANOPTIX IOL Intraocular Lens  [id number]   Left 1 Implanted     [id number] 21.0 D PanOptix IOL   ULTRASOUND TIME: 11  % of 1 minutes 0 seconds, CDE 6.5  SURGEON:  Wyonia Hough, MD   ANESTHESIA:  Topical with tetracaine drops and 2% Xylocaine jelly, augmented with 1% preservative-free intracameral lidocaine.    COMPLICATIONS:  None.   DESCRIPTION OF PROCEDURE:  The patient was identified in the holding room and transported to the operating room and placed in the supine position under the operating microscope.  The left eye was identified as the operative eye and it was prepped and draped in the usual sterile ophthalmic fashion.   A 1 millimeter clear-corneal paracentesis was made at the 1:30 position.  0.5 ml of preservative-free 1% lidocaine was injected into the anterior chamber.  The anterior chamber was filled with Viscoat viscoelastic.  A 2.4 millimeter keratome was used to make a near-clear corneal incision at the 10:30 position.  .  A curvilinear capsulorrhexis was made with a cystotome and capsulorrhexis forceps.  Balanced salt solution was used to hydrodissect and hydrodelineate the nucleus.   Phacoemulsification was then used in stop and chop fashion to remove the lens nucleus and epinucleus.  The remaining cortex was then removed using the irrigation and aspiration handpiece. Provisc was then placed into the capsular bag to distend it for lens placement.  A lens was then injected into the capsular bag.  The remaining viscoelastic was aspirated.   Wounds were hydrated with  balanced salt solution.  The anterior chamber was inflated to a physiologic pressure with balanced salt solution.  No wound leaks were noted. Cefuroxime 0.1 ml of a 10mg /ml solution was injected into the anterior chamber for a dose of 1 mg of intracameral antibiotic at the completion of the case.   Timolol and Brimonidine drops were applied to the eye.  The patient was taken to the recovery room in stable condition without complications of anesthesia or surgery.  [name] 09/13/2018, 11:58 AM

## 2018-09-13 NOTE — Anesthesia Preprocedure Evaluation (Signed)
Anesthesia Evaluation  Patient identified by MRN, date of birth, ID band  Reviewed: NPO status   History of Anesthesia Complications Negative for: history of anesthetic complications  Airway Mallampati: II  TM Distance: >3 FB Neck ROM: full    Dental no notable dental hx.    Pulmonary neg pulmonary ROS,    Pulmonary exam normal        Cardiovascular Exercise Tolerance: Good hypertension, Normal cardiovascular exam     Neuro/Psych Anxiety negative neurological ROS  negative psych ROS   GI/Hepatic negative GI ROS, Neg liver ROS,   Endo/Other  diabetes  Renal/GU CRFRenal disease (ckd3)  negative genitourinary   Musculoskeletal   Abdominal   Peds  Hematology negative hematology ROS (+)   Anesthesia Other Findings covid : NEG   Reproductive/Obstetrics                             Anesthesia Physical Anesthesia Plan  ASA: II  Anesthesia Plan: MAC   Post-op Pain Management:    Induction:   PONV Risk Score and Plan:   Airway Management Planned:   Additional Equipment:   Intra-op Plan:   Post-operative Plan:   Informed Consent: I have reviewed the patients History and Physical, chart, labs and discussed the procedure including the risks, benefits and alternatives for the proposed anesthesia with the patient or authorized representative who has indicated his/her understanding and acceptance.       Plan Discussed with: CRNA  Anesthesia Plan Comments:         Anesthesia Quick Evaluation

## 2018-09-13 NOTE — Anesthesia Postprocedure Evaluation (Signed)
Anesthesia Post Note  Patient: Valerie Farmer  Procedure(s) Performed: CATARACT EXTRACTION PHACO AND INTRAOCULAR LENS PLACEMENT (IOC) LEFT DIABETES  panoptic lens (Left Eye)  Patient location during evaluation: PACU Anesthesia Type: MAC Level of consciousness: awake and alert Pain management: pain level controlled Vital Signs Assessment: post-procedure vital signs reviewed and stable Respiratory status: spontaneous breathing, nonlabored ventilation, respiratory function stable and patient connected to nasal cannula oxygen Cardiovascular status: stable and blood pressure returned to baseline Postop Assessment: no apparent nausea or vomiting Anesthetic complications: no    Kimanh Templeman

## 2018-09-13 NOTE — Anesthesia Procedure Notes (Signed)
Procedure Name: MAC Date/Time: 09/13/2018 11:37 AM Performed by: Cameron Ali, CRNA Pre-anesthesia Checklist: Patient identified, Emergency Drugs available, Suction available, Timeout performed and Patient being monitored Patient Re-evaluated:Patient Re-evaluated prior to induction Oxygen Delivery Method: Nasal cannula Placement Confirmation: positive ETCO2

## 2018-09-13 NOTE — Transfer of Care (Signed)
Immediate Anesthesia Transfer of Care Note  Patient: Valerie Farmer  Procedure(s) Performed: CATARACT EXTRACTION PHACO AND INTRAOCULAR LENS PLACEMENT (IOC) LEFT DIABETES  panoptic lens (Left Eye)  Patient Location: PACU  Anesthesia Type: MAC  Level of Consciousness: awake, alert  and patient cooperative  Airway and Oxygen Therapy: Patient Spontanous Breathing and Patient connected to supplemental oxygen  Post-op Assessment: Post-op Vital signs reviewed, Patient's Cardiovascular Status Stable, Respiratory Function Stable, Patent Airway and No signs of Nausea or vomiting  Post-op Vital Signs: Reviewed and stable  Complications: No apparent anesthesia complications

## 2018-09-14 ENCOUNTER — Encounter: Payer: Self-pay | Admitting: Ophthalmology

## 2018-09-20 ENCOUNTER — Other Ambulatory Visit: Payer: Self-pay | Admitting: Obstetrics & Gynecology

## 2018-09-20 DIAGNOSIS — Z1231 Encounter for screening mammogram for malignant neoplasm of breast: Secondary | ICD-10-CM

## 2018-10-03 NOTE — Discharge Instructions (Signed)

## 2018-10-06 ENCOUNTER — Other Ambulatory Visit
Admission: RE | Admit: 2018-10-06 | Discharge: 2018-10-06 | Disposition: A | Discharge status: Home / Self Care | Payer: Medicare Other | Source: Ambulatory Visit | Attending: Ophthalmology | Admitting: Ophthalmology

## 2018-10-06 ENCOUNTER — Other Ambulatory Visit: Payer: Self-pay

## 2018-10-06 DIAGNOSIS — Z1159 Encounter for screening for other viral diseases: Secondary | ICD-10-CM | POA: Insufficient documentation

## 2018-10-06 DIAGNOSIS — Z01812 Encounter for preprocedural laboratory examination: Secondary | ICD-10-CM | POA: Diagnosis present

## 2018-10-07 LAB — SARS CORONAVIRUS 2 (TAT 6-24 HRS): SARS Coronavirus 2: NEGATIVE

## 2018-10-11 ENCOUNTER — Encounter
Admission: RE | Disposition: A | Discharge status: Home / Self Care | Payer: Self-pay | Source: Home / Self Care | Attending: Ophthalmology

## 2018-10-11 ENCOUNTER — Ambulatory Visit: Payer: Medicare Other | Admitting: Anesthesiology

## 2018-10-11 ENCOUNTER — Ambulatory Visit
Admission: RE | Admit: 2018-10-11 | Discharge: 2018-10-11 | Disposition: A | Discharge status: Home / Self Care | Payer: Medicare Other | Attending: Ophthalmology | Admitting: Ophthalmology

## 2018-10-11 DIAGNOSIS — F419 Anxiety disorder, unspecified: Secondary | ICD-10-CM | POA: Diagnosis not present

## 2018-10-11 DIAGNOSIS — M8580 Other specified disorders of bone density and structure, unspecified site: Secondary | ICD-10-CM | POA: Diagnosis not present

## 2018-10-11 DIAGNOSIS — E119 Type 2 diabetes mellitus without complications: Secondary | ICD-10-CM | POA: Insufficient documentation

## 2018-10-11 DIAGNOSIS — Z79899 Other long term (current) drug therapy: Secondary | ICD-10-CM | POA: Insufficient documentation

## 2018-10-11 DIAGNOSIS — H2511 Age-related nuclear cataract, right eye: Secondary | ICD-10-CM | POA: Insufficient documentation

## 2018-10-11 DIAGNOSIS — I10 Essential (primary) hypertension: Secondary | ICD-10-CM | POA: Insufficient documentation

## 2018-10-11 DIAGNOSIS — Z7982 Long term (current) use of aspirin: Secondary | ICD-10-CM | POA: Insufficient documentation

## 2018-10-11 DIAGNOSIS — Z7984 Long term (current) use of oral hypoglycemic drugs: Secondary | ICD-10-CM | POA: Diagnosis not present

## 2018-10-11 DIAGNOSIS — E7800 Pure hypercholesterolemia, unspecified: Secondary | ICD-10-CM | POA: Insufficient documentation

## 2018-10-11 HISTORY — PX: CATARACT EXTRACTION W/PHACO: SHX586

## 2018-10-11 LAB — GLUCOSE, CAPILLARY
Glucose-Capillary: 107 mg/dL — ABNORMAL HIGH (ref 70–99)
Glucose-Capillary: 100 mg/dL — ABNORMAL HIGH (ref 70–99)

## 2018-10-11 SURGERY — PHACOEMULSIFICATION, CATARACT, WITH IOL INSERTION
Anesthesia: Monitor Anesthesia Care | Site: Eye | Laterality: Right

## 2018-10-11 MED ORDER — TETRACAINE HCL 0.5 % OP SOLN
1.0000 [drp] | OPHTHALMIC | Status: DC | PRN
  Administered 2018-10-11 (×3): 1 [drp] via OPHTHALMIC

## 2018-10-11 MED ORDER — EPINEPHRINE PF 1 MG/ML IJ SOLN
INTRAOCULAR | Status: DC | PRN
  Administered 2018-10-11: 61 mL via OPHTHALMIC

## 2018-10-11 MED ORDER — OXYCODONE HCL 5 MG PO TABS: 5.0000 mg | ORAL_TABLET | Freq: Once | ORAL | Status: DC | PRN

## 2018-10-11 MED ORDER — FENTANYL CITRATE (PF) 100 MCG/2ML IJ SOLN
INTRAMUSCULAR | Status: DC | PRN
  Administered 2018-10-11 (×2): 50 ug via INTRAVENOUS

## 2018-10-11 MED ORDER — LIDOCAINE HCL (PF) 2 % IJ SOLN
INTRAOCULAR | Status: DC | PRN
  Administered 2018-10-11: 08:00:00 2 mL

## 2018-10-11 MED ORDER — BRIMONIDINE TARTRATE-TIMOLOL 0.2-0.5 % OP SOLN
OPHTHALMIC | Status: DC | PRN
  Administered 2018-10-11: 1 [drp] via OPHTHALMIC

## 2018-10-11 MED ORDER — MIDAZOLAM HCL 2 MG/2ML IJ SOLN
INTRAMUSCULAR | Status: DC | PRN
  Administered 2018-10-11: 2 mg via INTRAVENOUS

## 2018-10-11 MED ORDER — ARMC OPHTHALMIC DILATING DROPS
1.0000 "application " | OPHTHALMIC | Status: DC | PRN
  Administered 2018-10-11 (×3): 1 via OPHTHALMIC

## 2018-10-11 MED ORDER — NA HYALUR & NA CHOND-NA HYALUR 0.4-0.35 ML IO KIT
PACK | INTRAOCULAR | Status: DC | PRN
  Administered 2018-10-11: 1 mL via INTRAOCULAR

## 2018-10-11 MED ORDER — OXYCODONE HCL 5 MG/5ML PO SOLN: 5.0000 mg | Freq: Once | ORAL | Status: DC | PRN

## 2018-10-11 MED ORDER — MOXIFLOXACIN HCL 0.5 % OP SOLN
1.0000 [drp] | OPHTHALMIC | Status: DC | PRN
  Administered 2018-10-11 (×3): 1 [drp] via OPHTHALMIC

## 2018-10-11 MED ORDER — CEFUROXIME OPHTHALMIC INJECTION 1 MG/0.1 ML
INJECTION | OPHTHALMIC | Status: DC | PRN
  Administered 2018-10-11: 0.1 mL via INTRACAMERAL

## 2018-10-11 SURGICAL SUPPLY — 19 items
CANNULA ANT/CHMB 27GA (MISCELLANEOUS) ×6 IMPLANT
GLOVE SURG LX 7.5 STRW (GLOVE) ×2
GLOVE SURG LX STRL 7.5 STRW (GLOVE) ×1 IMPLANT
GLOVE SURG TRIUMPH 8.0 PF LTX (GLOVE) ×3 IMPLANT
GOWN STRL REUS W/ TWL LRG LVL3 (GOWN DISPOSABLE) ×2 IMPLANT
GOWN STRL REUS W/TWL LRG LVL3 (GOWN DISPOSABLE) ×4
LENS IOL ACRSF IQ PAN 21.5 ×1 IMPLANT
LENS IOL IQ PANOPTIX 21.5 ×3 IMPLANT
MARKER SKIN DUAL TIP RULER LAB (MISCELLANEOUS) ×3 IMPLANT
NEEDLE FILTER BLUNT 18X 1/2SAF (NEEDLE) ×2
NEEDLE FILTER BLUNT 18X1 1/2 (NEEDLE) ×1 IMPLANT
PACK CATARACT BRASINGTON (MISCELLANEOUS) ×3 IMPLANT
PACK EYE AFTER SURG (MISCELLANEOUS) ×3 IMPLANT
PACK OPTHALMIC (MISCELLANEOUS) ×3 IMPLANT
SYR 3ML LL SCALE MARK (SYRINGE) ×3 IMPLANT
SYR 5ML LL (SYRINGE) ×3 IMPLANT
SYR TB 1ML LUER SLIP (SYRINGE) ×3 IMPLANT
WATER STERILE IRR 500ML POUR (IV SOLUTION) ×3 IMPLANT
WIPE NON LINTING 3.25X3.25 (MISCELLANEOUS) ×3 IMPLANT

## 2018-10-11 NOTE — Anesthesia Preprocedure Evaluation (Signed)
Anesthesia Evaluation  Patient identified by MRN, date of birth, ID band Patient awake    Reviewed: Allergy & Precautions, H&P , NPO status , Patient's Chart, lab work & pertinent test results  History of Anesthesia Complications Negative for: history of anesthetic complications  Airway Mallampati: II  TM Distance: >3 FB Neck ROM: full    Dental no notable dental hx.    Pulmonary neg pulmonary ROS,    Pulmonary exam normal breath sounds clear to auscultation       Cardiovascular hypertension, On Medications Normal cardiovascular exam Rhythm:regular Rate:Normal     Neuro/Psych negative neurological ROS     GI/Hepatic negative GI ROS,   Endo/Other  diabetes, Well Controlled, Type 2  Renal/GU   negative genitourinary   Musculoskeletal   Abdominal   Peds  Hematology negative hematology ROS (+)   Anesthesia Other Findings   Reproductive/Obstetrics                             Anesthesia Physical Anesthesia Plan  ASA: II  Anesthesia Plan: MAC   Post-op Pain Management:    Induction:   PONV Risk Score and Plan:   Airway Management Planned:   Additional Equipment:   Intra-op Plan:   Post-operative Plan:   Informed Consent: I have reviewed the patients History and Physical, chart, labs and discussed the procedure including the risks, benefits and alternatives for the proposed anesthesia with the patient or authorized representative who has indicated his/her understanding and acceptance.       Plan Discussed with:   Anesthesia Plan Comments:         Anesthesia Quick Evaluation

## 2018-10-11 NOTE — Transfer of Care (Signed)
Immediate Anesthesia Transfer of Care Note  Patient: Valerie Farmer  Procedure(s) Performed: CATARACT EXTRACTION PHACO AND INTRAOCULAR LENS PLACEMENT (IOC)  RIGHT DIABETIC PANOPTIX LENS (Right Eye)  Patient Location: PACU  Anesthesia Type: MAC  Level of Consciousness: awake, alert  and patient cooperative  Airway and Oxygen Therapy: Patient Spontanous Breathing and Patient connected to supplemental oxygen  Post-op Assessment: Post-op Vital signs reviewed, Patient's Cardiovascular Status Stable, Respiratory Function Stable, Patent Airway and No signs of Nausea or vomiting  Post-op Vital Signs: Reviewed and stable  Complications: No apparent anesthesia complications

## 2018-10-11 NOTE — Anesthesia Postprocedure Evaluation (Signed)
Anesthesia Post Note  Patient: Valerie Farmer  Procedure(s) Performed: CATARACT EXTRACTION PHACO AND INTRAOCULAR LENS PLACEMENT (IOC)  RIGHT DIABETIC PANOPTIX LENS (Right Eye)  Patient location during evaluation: PACU Anesthesia Type: MAC Level of consciousness: awake Pain management: pain level controlled Vital Signs Assessment: post-procedure vital signs reviewed and stable Respiratory status: spontaneous breathing Cardiovascular status: stable Anesthetic complications: no    Romanita Fager, III,  Antonisha Waskey D

## 2018-10-11 NOTE — Op Note (Signed)
LOCATION:  [place]   PREOPERATIVE DIAGNOSIS:    Nuclear sclerotic cataract right eye. H25.11   POSTOPERATIVE DIAGNOSIS:  Nuclear sclerotic cataract right eye.     PROCEDURE:  Phacoemusification with posterior chamber intraocular lens placement of the right eye   LENS:   Implant Name Type Inv. Item Serial No. Manufacturer Lot No. LRB No. Used Action  AcrySof IQ PanOptix Trifocal IOL   [id number]   Right 1 Implanted     [id number] 21.5 D   ULTRASOUND TIME: 12 % of 0 minutes, 60 seconds.  CDE 7.1   SURGEON:  Wyonia Hough, MD   ANESTHESIA:  Topical with tetracaine drops and 2% Xylocaine jelly, augmented with 1% preservative-free intracameral lidocaine.    COMPLICATIONS:  None.   DESCRIPTION OF PROCEDURE:  The patient was identified in the holding room and transported to the operating room and placed in the supine position under the operating microscope.  The right eye was identified as the operative eye and it was prepped and draped in the usual sterile ophthalmic fashion.   A 1 millimeter clear-corneal paracentesis was made at the 12:00 position.  0.5 ml of preservative-free 1% lidocaine was injected into the anterior chamber. The anterior chamber was filled with Viscoat viscoelastic.  A 2.4 millimeter keratome was used to make a near-clear corneal incision at the 9:00 position.  A curvilinear capsulorrhexis was made with a cystotome and capsulorrhexis forceps.  Balanced salt solution was used to hydrodissect and hydrodelineate the nucleus.   Phacoemulsification was then used in stop and chop fashion to remove the lens nucleus and epinucleus.  The remaining cortex was then removed using the irrigation and aspiration handpiece. Provisc was then placed into the capsular bag to distend it for lens placement.  A lens was then injected into the capsular bag.  The remaining viscoelastic was aspirated.   Wounds were hydrated with balanced salt solution.  The anterior chamber  was inflated to a physiologic pressure with balanced salt solution.  No wound leaks were noted. Cefuroxime 0.1 ml of a 10mg /ml solution was injected into the anterior chamber for a dose of 1 mg of intracameral antibiotic at the completion of the case.   Timolol and Brimonidine drops were applied to the eye.  The patient was taken to the recovery room in stable condition without complications of anesthesia or surgery.   Valerie Farmer 10/11/2018, 7:56 AM

## 2018-10-11 NOTE — H&P (Signed)

## 2018-10-12 ENCOUNTER — Encounter: Payer: Self-pay | Admitting: Ophthalmology

## 2018-11-01 ENCOUNTER — Other Ambulatory Visit: Payer: Self-pay

## 2018-11-01 ENCOUNTER — Ambulatory Visit
Admission: RE | Admit: 2018-11-01 | Discharge: 2018-11-01 | Disposition: A | Discharge status: Home / Self Care | Payer: Medicare Other | Source: Ambulatory Visit | Attending: Obstetrics & Gynecology | Admitting: Obstetrics & Gynecology

## 2018-11-01 DIAGNOSIS — Z1231 Encounter for screening mammogram for malignant neoplasm of breast: Secondary | ICD-10-CM | POA: Diagnosis present

## 2018-11-02 ENCOUNTER — Other Ambulatory Visit: Payer: Self-pay | Admitting: Obstetrics & Gynecology

## 2018-11-02 DIAGNOSIS — R921 Mammographic calcification found on diagnostic imaging of breast: Secondary | ICD-10-CM

## 2018-11-02 DIAGNOSIS — R928 Other abnormal and inconclusive findings on diagnostic imaging of breast: Secondary | ICD-10-CM

## 2018-11-10 ENCOUNTER — Ambulatory Visit
Admission: RE | Admit: 2018-11-10 | Discharge: 2018-11-10 | Disposition: A | Discharge status: Home / Self Care | Payer: Medicare Other | Source: Ambulatory Visit | Attending: Obstetrics & Gynecology | Admitting: Obstetrics & Gynecology

## 2018-11-10 ENCOUNTER — Other Ambulatory Visit: Payer: Self-pay

## 2018-11-10 DIAGNOSIS — R921 Mammographic calcification found on diagnostic imaging of breast: Secondary | ICD-10-CM | POA: Insufficient documentation

## 2018-11-10 DIAGNOSIS — R928 Other abnormal and inconclusive findings on diagnostic imaging of breast: Secondary | ICD-10-CM | POA: Insufficient documentation

## 2018-12-25 ENCOUNTER — Ambulatory Visit (INDEPENDENT_AMBULATORY_CARE_PROVIDER_SITE_OTHER): Payer: Medicare Other | Admitting: Podiatry

## 2018-12-25 ENCOUNTER — Other Ambulatory Visit: Payer: Self-pay

## 2018-12-25 ENCOUNTER — Encounter: Payer: Self-pay | Admitting: Podiatry

## 2018-12-25 DIAGNOSIS — B351 Tinea unguium: Secondary | ICD-10-CM | POA: Diagnosis not present

## 2018-12-25 DIAGNOSIS — L84 Corns and callosities: Secondary | ICD-10-CM | POA: Diagnosis not present

## 2018-12-25 DIAGNOSIS — E119 Type 2 diabetes mellitus without complications: Secondary | ICD-10-CM

## 2018-12-25 DIAGNOSIS — M79676 Pain in unspecified toe(s): Secondary | ICD-10-CM

## 2018-12-25 DIAGNOSIS — M216X9 Other acquired deformities of unspecified foot: Secondary | ICD-10-CM | POA: Diagnosis not present

## 2018-12-25 NOTE — Progress Notes (Signed)
Complaint:  Visit Type: Patient returns to my office for continued preventative foot care services. Complaint: Patient states" my nails have grown long and thick and become painful to walk and wear shoes" Patient has been diagnosed with DM with no foot complications. The patient presents for preventative foot care services. No changes to ROS.  Patient also says she is experiencing pain in her right  forefoot.  Podiatric Exam: Vascular: dorsalis pedis and posterior tibial pulses are palpable bilateral. Capillary return is immediate. Temperature gradient is WNL. Skin turgor WNL  Sensorium: Normal Semmes Weinstein monofilament test. Normal tactile sensation bilaterally. Nail Exam: Pt has thick disfigured discolored nails with subungual debris noted bilateral entire nail hallux through fifth toenails Ulcer Exam: There is no evidence of ulcer or pre-ulcerative changes or infection. Orthopedic Exam: Muscle tone and strength are WNL. No limitations in general ROM. No crepitus or effusions noted. Foot type and digits show no abnormalities. Hammer toes 2-4  B/L.  Plantar flex metatarsal  2-4  B/L. Skin:  Porokeratosis sub 4 right foot.. No infection or ulcers  Diagnosis:  Onychomycosis, , Pain in right toe, pain in left toes,   Callus sub 4 right foot secondary to plantar flexed metatarsal right  Treatment & Plan Procedures and Treatment: Consent by patient was obtained for treatment procedures.   Debridement of mycotic and hypertrophic toenails, 1 through 5 bilateral and clearing of subungual debris. No ulceration, no infection noted. Debride porokeratosis sub 4 right. Patient to make an appointment with [facility] for insole evaluation. Return Visit-Office Procedure: Patient instructed to return to the office for a follow up visit 3 months for continued evaluation and treatment.    Gardiner Barefoot DPM

## 2018-12-27 ENCOUNTER — Ambulatory Visit: Payer: Medicare Other | Admitting: Orthotics

## 2018-12-27 ENCOUNTER — Other Ambulatory Visit: Payer: Self-pay

## 2018-12-27 DIAGNOSIS — B351 Tinea unguium: Secondary | ICD-10-CM

## 2018-12-27 DIAGNOSIS — M79676 Pain in unspecified toe(s): Secondary | ICD-10-CM

## 2018-12-27 DIAGNOSIS — M216X9 Other acquired deformities of unspecified foot: Secondary | ICD-10-CM

## 2018-12-27 NOTE — Progress Notes (Signed)
Patient was measured for med necessity extra depth shoes (x2) w/ 3 pair (x6) custom f/o. Patient was measured w/ brannock to determine size and width.  Foam impression mold was achieved and deemed appropriate for fabrication of  cmfo.   See DPM chart notes for further documentation and dx codes for determination of medical necessity.  Appropriate forms will be sent to PCP to verify and sign off on medical necessity.   Add first met head cutout offload, small met pad b/l.

## 2019-01-01 ENCOUNTER — Ambulatory Visit: Payer: Medicare Other | Admitting: Podiatry

## 2019-03-26 ENCOUNTER — Other Ambulatory Visit: Payer: Self-pay | Admitting: Obstetrics & Gynecology

## 2019-03-26 DIAGNOSIS — R921 Mammographic calcification found on diagnostic imaging of breast: Secondary | ICD-10-CM

## 2019-03-27 ENCOUNTER — Telehealth: Payer: Self-pay | Admitting: Podiatry

## 2019-03-27 NOTE — Telephone Encounter (Signed)
Pt left message asking if shoes were in.She was wanting to get them before the new year due to changing insurance.   I returned call and there was a problem with the paperwork so I have gotten it corrected and refaxed it to pcp. When shoes come in pt will get a call to schedule an appt.

## 2019-04-30 ENCOUNTER — Other Ambulatory Visit: Payer: Medicare Other

## 2019-05-14 ENCOUNTER — Ambulatory Visit
Admission: RE | Admit: 2019-05-14 | Discharge: 2019-05-14 | Disposition: A | Discharge status: Home / Self Care | Payer: Medicare PPO | Source: Ambulatory Visit | Attending: Obstetrics & Gynecology | Admitting: Obstetrics & Gynecology

## 2019-05-14 ENCOUNTER — Other Ambulatory Visit: Payer: Self-pay | Admitting: Obstetrics & Gynecology

## 2019-05-14 DIAGNOSIS — R921 Mammographic calcification found on diagnostic imaging of breast: Secondary | ICD-10-CM

## 2019-05-14 DIAGNOSIS — R928 Other abnormal and inconclusive findings on diagnostic imaging of breast: Secondary | ICD-10-CM

## 2019-05-14 NOTE — Progress Notes (Signed)
Schedule Annual as she is due and also to discuss MMG

## 2019-05-15 ENCOUNTER — Telehealth: Payer: Self-pay | Admitting: Obstetrics & Gynecology

## 2019-05-15 NOTE — Telephone Encounter (Signed)
Patient is schedule for 06/08/19 with [facility]

## 2019-05-15 NOTE — Telephone Encounter (Signed)
-----   Message from Gae Dry, MD sent at 05/14/2019  2:51 PM EST ----- Schedule Annual as she is due and also to discuss MMG

## 2019-05-22 ENCOUNTER — Ambulatory Visit: Payer: Medicare PPO

## 2019-05-25 ENCOUNTER — Ambulatory Visit
Admission: RE | Admit: 2019-05-25 | Discharge: 2019-05-25 | Disposition: A | Discharge status: Home / Self Care | Payer: Medicare PPO | Source: Ambulatory Visit | Attending: Obstetrics & Gynecology | Admitting: Obstetrics & Gynecology

## 2019-05-25 DIAGNOSIS — R921 Mammographic calcification found on diagnostic imaging of breast: Secondary | ICD-10-CM

## 2019-05-25 DIAGNOSIS — R928 Other abnormal and inconclusive findings on diagnostic imaging of breast: Secondary | ICD-10-CM

## 2019-05-25 HISTORY — PX: BREAST BIOPSY: SHX20

## 2019-05-28 LAB — SURGICAL PATHOLOGY

## 2019-05-31 ENCOUNTER — Telehealth: Payer: Self-pay | Admitting: Podiatry

## 2019-05-31 NOTE — Telephone Encounter (Signed)
Left message for pt to call to schedule an appt in [place] to pick up diabetic shoes.. the auth expires 3.29.2021.Marland KitchenMarland Kitchen

## 2019-06-01 NOTE — Telephone Encounter (Signed)
Pt scheduled for 3.9.2021 with Betha.Marland KitchenMarland Kitchen

## 2019-06-05 ENCOUNTER — Other Ambulatory Visit: Payer: Self-pay

## 2019-06-05 ENCOUNTER — Ambulatory Visit: Payer: Medicare PPO | Admitting: Orthotics

## 2019-06-05 DIAGNOSIS — L84 Corns and callosities: Secondary | ICD-10-CM | POA: Diagnosis not present

## 2019-06-05 DIAGNOSIS — M216X1 Other acquired deformities of right foot: Secondary | ICD-10-CM | POA: Diagnosis not present

## 2019-06-05 DIAGNOSIS — M216X2 Other acquired deformities of left foot: Secondary | ICD-10-CM | POA: Diagnosis not present

## 2019-06-05 DIAGNOSIS — E119 Type 2 diabetes mellitus without complications: Secondary | ICD-10-CM | POA: Diagnosis not present

## 2019-06-06 ENCOUNTER — Telehealth: Payer: Self-pay | Admitting: *Deleted

## 2019-06-06 DIAGNOSIS — M79676 Pain in unspecified toe(s): Secondary | ICD-10-CM

## 2019-06-06 NOTE — Telephone Encounter (Signed)
I left a message for Valerie Farmer to give me a call back.  I need to inform her that the shoes are $150 a pair.  She wants two pair.  She needs to pay $150 initially before ordering the shoes and she must pay the remainder when she picks them up.

## 2019-06-07 NOTE — Telephone Encounter (Signed)
Valerie Farmer came in today and paid $150 for her Diabetic Shoes.  She decided that she does want to get both pair after stating yesterday that she only wanted one pair.

## 2019-06-08 ENCOUNTER — Ambulatory Visit: Payer: Medicare Other | Admitting: Obstetrics & Gynecology

## 2019-07-05 ENCOUNTER — Telehealth: Payer: Self-pay | Admitting: Podiatry

## 2019-07-05 NOTE — Telephone Encounter (Signed)
2 pr shoes in [place] to be taken to [place] office for pt to pick up. Pt only wants 1 pr and has paid for them but will try them on in the waiting room to see which ones she is likes best. She is aware they will not be in [place] office until Monday 4.12.2021.Marland KitchenMarland Kitchen

## 2019-07-16 ENCOUNTER — Other Ambulatory Visit: Payer: Self-pay

## 2019-07-16 ENCOUNTER — Ambulatory Visit (INDEPENDENT_AMBULATORY_CARE_PROVIDER_SITE_OTHER): Payer: Medicare PPO | Admitting: Obstetrics & Gynecology

## 2019-07-16 ENCOUNTER — Other Ambulatory Visit: Payer: Self-pay | Admitting: Obstetrics & Gynecology

## 2019-07-16 ENCOUNTER — Encounter: Payer: Self-pay | Admitting: Obstetrics & Gynecology

## 2019-07-16 VITALS — BP 138/80 | Ht 62.0 in | Wt 175.0 lb

## 2019-07-16 DIAGNOSIS — M8580 Other specified disorders of bone density and structure, unspecified site: Secondary | ICD-10-CM

## 2019-07-16 DIAGNOSIS — Z01419 Encounter for gynecological examination (general) (routine) without abnormal findings: Secondary | ICD-10-CM | POA: Diagnosis not present

## 2019-07-16 DIAGNOSIS — Z1231 Encounter for screening mammogram for malignant neoplasm of breast: Secondary | ICD-10-CM | POA: Diagnosis not present

## 2019-07-16 NOTE — Progress Notes (Signed)
HPI:      [name] is a 69 y.o. presents today for her annual examination.  The patient has no complaints today.  Recent fall with left wrist fracture. The patient is not currently sexually active. Herlast pap: approximate date 2017 and was normal and last mammogram: 10/2018, then f/u 04/2019 w [facility] (neg).  The patient does perform self breast exams.  There is notable family history of breast or ovarian cancer in her family. The patient is not taking hormone replacement therapy. Patient denies post-menopausal vaginal bleeding.  Also no bladder sx's.  The patient has regular exercise: yes. The patient denies current symptoms of depression.    GYN Hx: Last Colonoscopy:4 years ago. Normal.  Last DEXA: 4 years ago.    PMHx: Past Medical History:  Diagnosis Date  . Anxiety   . Diabetes ([place])    type 2  . HBP (high blood pressure)   . Hyperlipidemia   . Hypertension   . Pancreatitis 2005  . Skin cancer   . Swelling    Past Surgical History:  Procedure Laterality Date  . BREAST BIOPSY Right   . BREAST BIOPSY Right 05/25/2019   Affirm bx-"X" clip-path pending  . BREAST CYST ASPIRATION Left 2015  . BREAST SURGERY Left 09/05/2013   FNA: Comments: LEFT BREAST  . BREAST SURGERY Right 1990  . CATARACT EXTRACTION W/PHACO Left 09/13/2018   Procedure: CATARACT EXTRACTION PHACO AND INTRAOCULAR LENS PLACEMENT ([place]) LEFT DIABETES  panoptic lens;  Surgeon: Leandrew Koyanagi, MD;  Location: [facility];  Service: Ophthalmology;  Laterality: Left;  Diabetic - oral meds  . CATARACT EXTRACTION W/PHACO Right 10/11/2018   Procedure: CATARACT EXTRACTION PHACO AND INTRAOCULAR LENS PLACEMENT ([place])  RIGHT DIABETIC PANOPTIX LENS;  Surgeon: Leandrew Koyanagi, MD;  Location: [place];  Service: Ophthalmology;  Laterality: Right;  DIABETIC  . CHOLECYSTECTOMY  2005  . COLONOSCOPY  2007   DR. Byrnett, normal study  . COLONOSCOPY WITH PROPOFOL N/A 11/10/2015   Procedure: COLONOSCOPY  WITH PROPOFOL;  Surgeon: Obera Stauch Bellow, MD;  Location: [facility] ENDOSCOPY;  Service: Endoscopy;  Laterality: N/A;  . VAGINAL HYSTERECTOMY  2005   Family History  Problem Relation Age of Onset  . Bladder Cancer Father   . Breast cancer Paternal Aunt   . Leukemia Maternal Aunt   . Bladder Cancer Maternal Uncle    Social History   Tobacco Use  . Smoking status: Never Smoker  . Smokeless tobacco: Never Used  Substance Use Topics  . Alcohol use: No  . Drug use: No    Current Outpatient Medications:  .  ALPRAZolam (XANAX) 0.25 MG tablet, Take by mouth at bedtime as needed. Reported on 10/01/2015, Disp: , Rfl:  .  aspirin EC 81 MG tablet, Take 81 mg by mouth daily. , Disp: , Rfl:  .  atorvastatin (LIPITOR) 20 MG tablet, Take 20 mg by mouth daily. , Disp: , Rfl:  .  calcium-vitamin D (OSCAL WITH D) 500-200 MG-UNIT TABS tablet, Take by mouth., Disp: , Rfl:  .  clobetasol cream (TEMOVATE) [id number] %, Apply 1 application topically 2 (two) times daily., Disp: , Rfl:  .  escitalopram (LEXAPRO) 10 MG tablet, Take 10 mg by mouth. Take 1 1/2 tabs daily, Disp: , Rfl:  .  glipiZIDE (GLUCOTROL) 10 MG tablet, Take 5 mg by mouth daily before breakfast. , Disp: , Rfl:  .  hydrochlorothiazide (HYDRODIURIL) 12.5 MG tablet, Take 12.5 mg by mouth daily., Disp: , Rfl:  .  ketoconazole (NIZORAL) 2 % cream, APPLY ON THE SKIN DAILY AS NEEDED, Disp: , Rfl: 0 .  losartan (COZAAR) 50 MG tablet, Take 50 mg by mouth daily., Disp: , Rfl:  .  metFORMIN (GLUCOPHAGE-XR) 500 MG 24 hr tablet, , Disp: , Rfl: 0 .  mometasone (NASONEX) 50 MCG/ACT nasal spray, Place 2 sprays into the nose daily as needed. , Disp: , Rfl:  .  ONE TOUCH ULTRA TEST test strip, , Disp: , Rfl:  .  Probiotic Product (ALIGN PO), Take 1 tablet by mouth daily. , Disp: , Rfl:  .  sitaGLIPtin (JANUVIA) 25 MG tablet, Take 25 mg by mouth daily., Disp: , Rfl:  .  traMADol (ULTRAM) 50 MG tablet, Take 1 tablet (50 mg total) by mouth every 6 (six) hours as needed.,  Disp: 10 tablet, Rfl: 0 Allergies: Other  Review of Systems  Constitutional: Negative for chills, fever and malaise/fatigue.  HENT: Negative for congestion, sinus pain and sore throat.   Eyes: Negative for blurred vision and pain.  Respiratory: Negative for cough and wheezing.   Cardiovascular: Negative for chest pain and leg swelling.  Gastrointestinal: Negative for abdominal pain, constipation, diarrhea, heartburn, nausea and vomiting.  Genitourinary: Negative for dysuria, frequency, hematuria and urgency.  Musculoskeletal: Negative for back pain, joint pain, myalgias and neck pain.  Skin: Positive for itching and rash.  Neurological: Negative for dizziness, tremors and weakness.  Endo/Heme/Allergies: Does not bruise/bleed easily.  Psychiatric/Behavioral: Negative for depression. The patient is not nervous/anxious and does not have insomnia.     Objective: BP 138/80   Ht 5\' 2"  (1.575 m)   Wt 175 lb (79.4 kg)   BMI 32.01 kg/m   Filed Weights   07/16/19 1318  Weight: 175 lb (79.4 kg)   Body mass index is 32.01 kg/m. Physical Exam Constitutional:      General: She is not in acute distress.    Appearance: She is well-developed.  Genitourinary:     Pelvic exam was performed with patient supine.     Vagina and rectum normal.     No lesions in the vagina.     No vaginal bleeding.     No right or left adnexal mass present.     Right adnexa not tender.     Left adnexa not tender.     Genitourinary Comments: Absent Uterus Absent cervix Vaginal cuff well healed  HENT:     Head: Normocephalic and atraumatic. No laceration.     Right Ear: Hearing normal.     Left Ear: Hearing normal.     Mouth/Throat:     Pharynx: Uvula midline.  Eyes:     Pupils: Pupils are equal, round, and reactive to light.  Neck:     Thyroid: No thyromegaly.  Cardiovascular:     Rate and Rhythm: Normal rate and regular rhythm.     Heart sounds: No murmur. No friction rub. No gallop.   Pulmonary:      Effort: Pulmonary effort is normal. No respiratory distress.     Breath sounds: Normal breath sounds. No wheezing.  Chest:     Breasts:        Right: No mass, skin change or tenderness.        Left: No mass, skin change or tenderness.  Abdominal:     General: Bowel sounds are normal. There is no distension.     Palpations: Abdomen is soft.     Tenderness: There is no abdominal tenderness. There is no rebound.  Musculoskeletal:        General: Normal range of motion.     Cervical back: Normal range of motion and neck supple.  Neurological:     Mental Status: She is alert and oriented to person, place, and time.     Cranial Nerves: No cranial nerve deficit.  Skin:    General: Skin is warm and dry.  Psychiatric:        Judgment: Judgment normal.  Vitals reviewed.    Assessment: Annual Exam 1. Women's annual routine gynecological examination   2. Osteopenia, unspecified location   3. Encounter for screening mammogram for malignant neoplasm of breast     Plan:            1.  Cervical Screening-  Pap smear schedule reviewed with patient  2. Breast screening- Exam annually and mammogram scheduled (Screening due in August)  3. Colonoscopy every 10 years, Hemoccult testing after age 62  4. Labs managed by PCP  5. Counseling for hormonal therapy: none              6. FRAX - not done.  Based on age today and recent fracture (and prior h/o osteopenia by former DEXA), a DEXA is scheduled.    F/U  Return in about 1 year (around 07/15/2020) for Annual.  Barnett Applebaum, MD, [facility], [facility] 07/16/2019  1:53 PM

## 2019-07-16 NOTE — Patient Instructions (Signed)
Bone Density Test The bone density test uses a special type of X-ray to measure the amount of calcium and other minerals in your bones. It can measure bone density in the hip and the spine. The test procedure is similar to having a regular X-ray. This test may also be called:  Bone densitometry.  Bone mineral density test.  Dual-energy X-ray absorptiometry (DEXA). You may have this test to:  Diagnose a condition that causes weak or thin bones (osteoporosis).  Screen you for osteoporosis.  Predict your risk for a broken bone (fracture).  Determine how well your osteoporosis treatment is working. Tell a health care provider about:  Any allergies you have.  All medicines you are taking, including vitamins, herbs, eye drops, creams, and over-the-counter medicines.  Any problems you or family members have had with anesthetic medicines.  Any blood disorders you have.  Any surgeries you have had.  Any medical conditions you have.  Whether you are pregnant or may be pregnant.  Any medical tests you have had within the past 14 days that used contrast material. What are the risks? Generally, this is a safe procedure. However, it does expose you to a small amount of radiation, which can slightly increase your cancer risk. What happens before the procedure?  Do not take any calcium supplements starting 24 hours before your test.  Remove all metal jewelry, eyeglasses, dental appliances, and any other metal objects. What happens during the procedure?   You will lie down on an exam table. There will be an X-ray generator below you and an imaging device above you.  Other devices, such as boxes or braces, may be used to position your body properly for the scan.  The machine will slowly scan your body. You will need to keep still.  The images will show up on a screen in the room. Images will be examined by a specialist after your test is done. The procedure may vary among health care  providers and hospitals. What happens after the procedure?  It is up to you to get your test results. Ask your health care provider, or the department that is doing the test, when your results will be ready. Summary  A bone density test is an imaging test that uses a type of X-ray to measure the amount of calcium and other minerals in your bones.  The test may be used to diagnose or screen you for a condition that causes weak or thin bones (osteoporosis), predict your risk for a broken bone (fracture), or determine how well your osteoporosis treatment is working.  Do not take any calcium supplements starting 24 hours before your test.  Ask your health care provider, or the department that is doing the test, when your results will be ready. This information is not intended to replace advice given to you by your health care provider. Make sure you discuss any questions you have with your health care provider. Document Revised: 03/31/2017 Document Reviewed: 01/17/2017 Elsevier Patient Education  2020 Elsevier Inc.  

## 2019-07-20 ENCOUNTER — Telehealth: Payer: Self-pay | Admitting: Podiatry

## 2019-07-20 NOTE — Telephone Encounter (Signed)
My guarantor number is [id number] and I'm looking at my bill. It says $150 and say's its past due. I ordered two pairs of shoes and I kept one and returned the other. I think that's what's happened. If you would call me back. Thank you. Bye bye.

## 2019-07-20 NOTE — Telephone Encounter (Signed)
Pt called and states she ordered 2 pr of shoes but returned 1 of them. She has paid for 1 pr and was billed for the second pair. Can we take the charges out for the 2nd pair that was returned.

## 2019-07-24 ENCOUNTER — Encounter: Payer: Self-pay | Admitting: General Practice

## 2019-08-02 ENCOUNTER — Other Ambulatory Visit: Payer: Self-pay | Admitting: Obstetrics & Gynecology

## 2019-08-02 NOTE — Progress Notes (Signed)
DEXA reviewed Osteopenia, improved from 2018 DEXA Cont current calcium and vit D regimen, and exercise D/w pt  Barnett Applebaum, MD, [facility] 08/02/2019  4:57 PM

## 2019-08-15 IMAGING — MG DIGITAL SCREENING BILATERAL MAMMOGRAM WITH TOMO AND CAD
6 of 10 series · 6 of 30 positions shown · non-contrast
Comparison: Previous exam(s).

CLINICAL DATA: Screening.

EXAM:
DIGITAL SCREENING BILATERAL MAMMOGRAM WITH TOMO AND CAD

[L CC synth-2D]
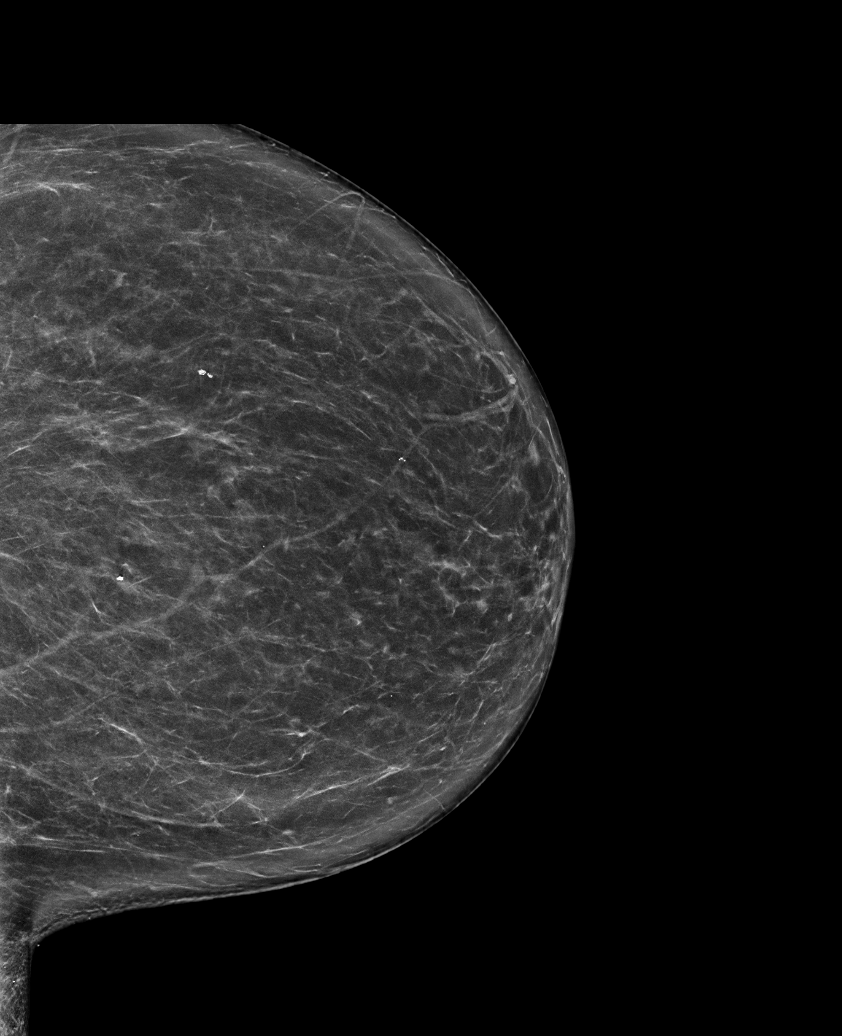

[R CC synth-2D (1 of 2)]
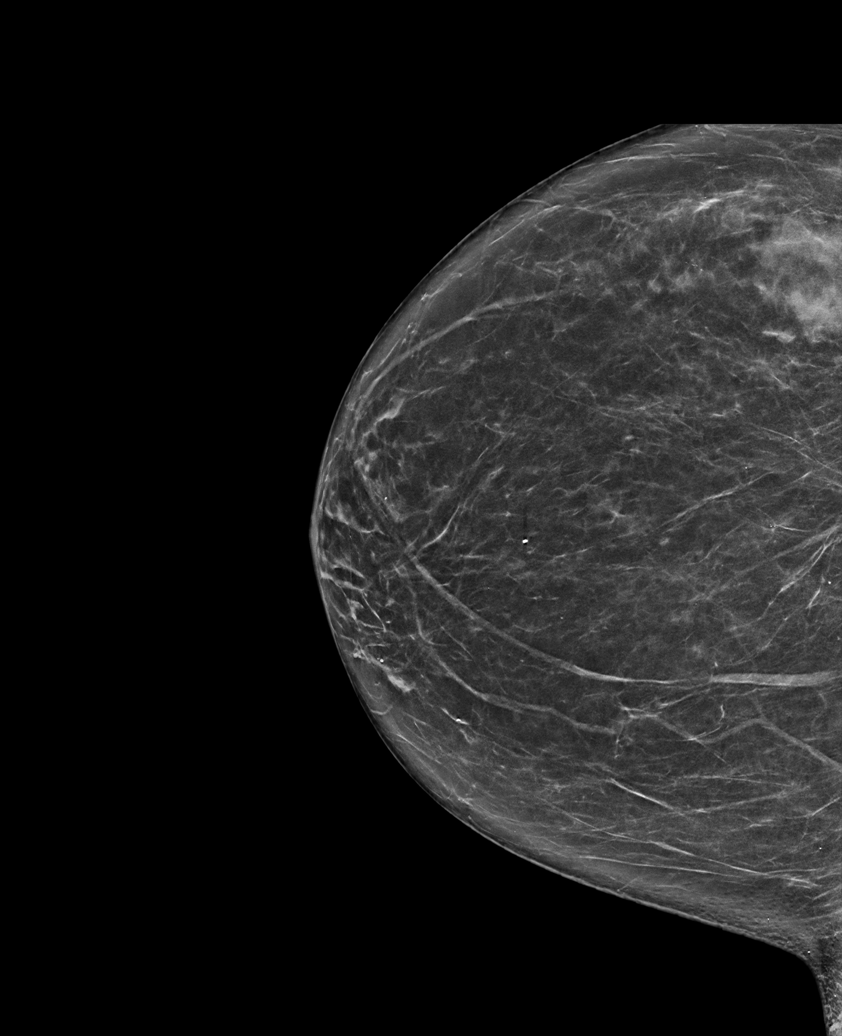

[R CC synth-2D (2 of 2)]
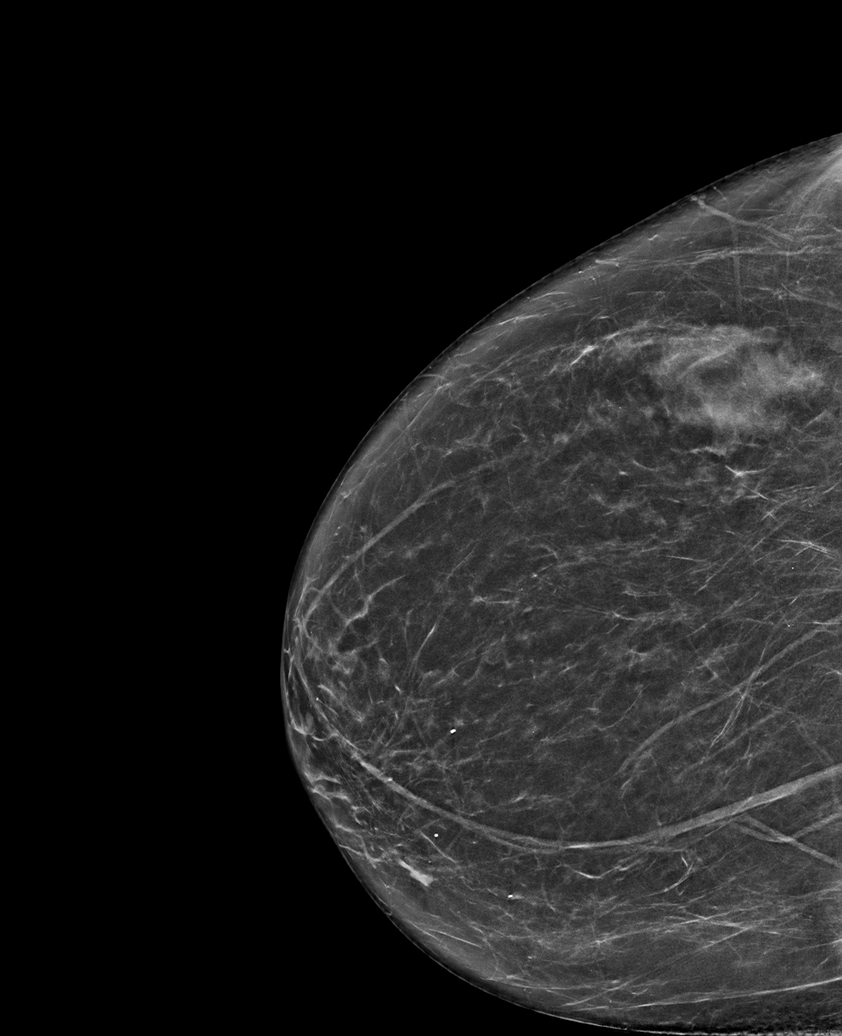

[L MLO synth-2D]
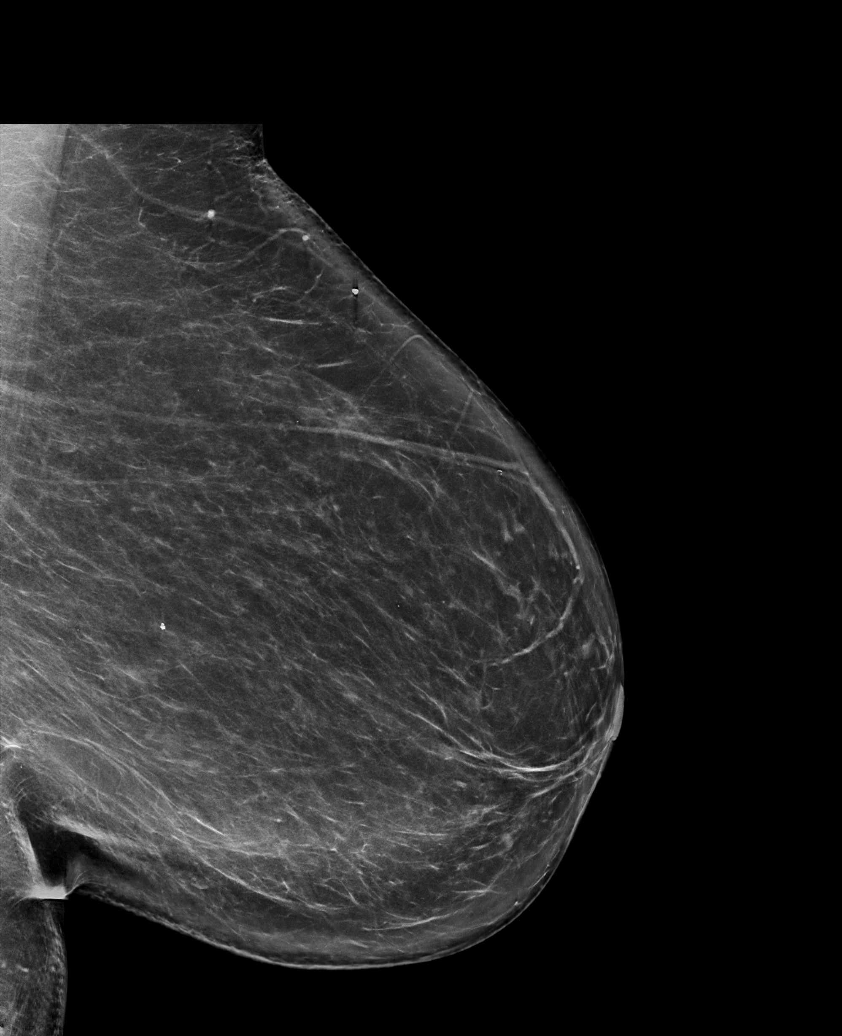

[R MLO synth-2D]
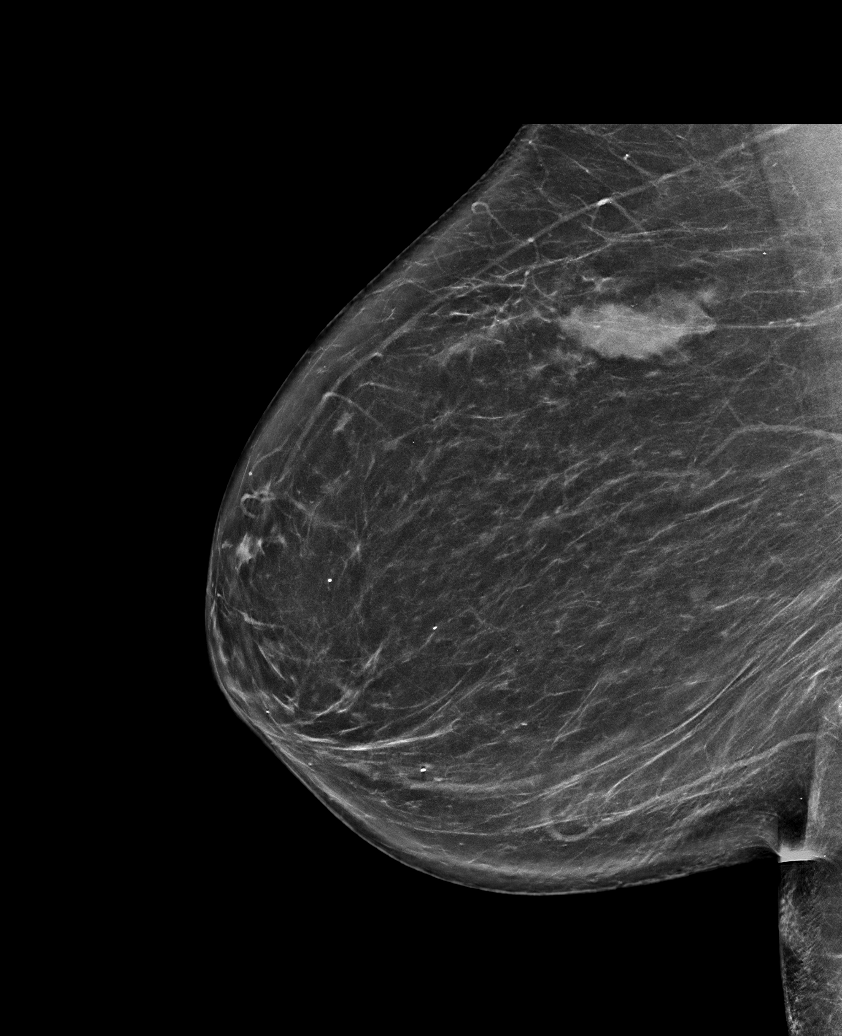

[L CC tomo · tomo slice 41/80.0]
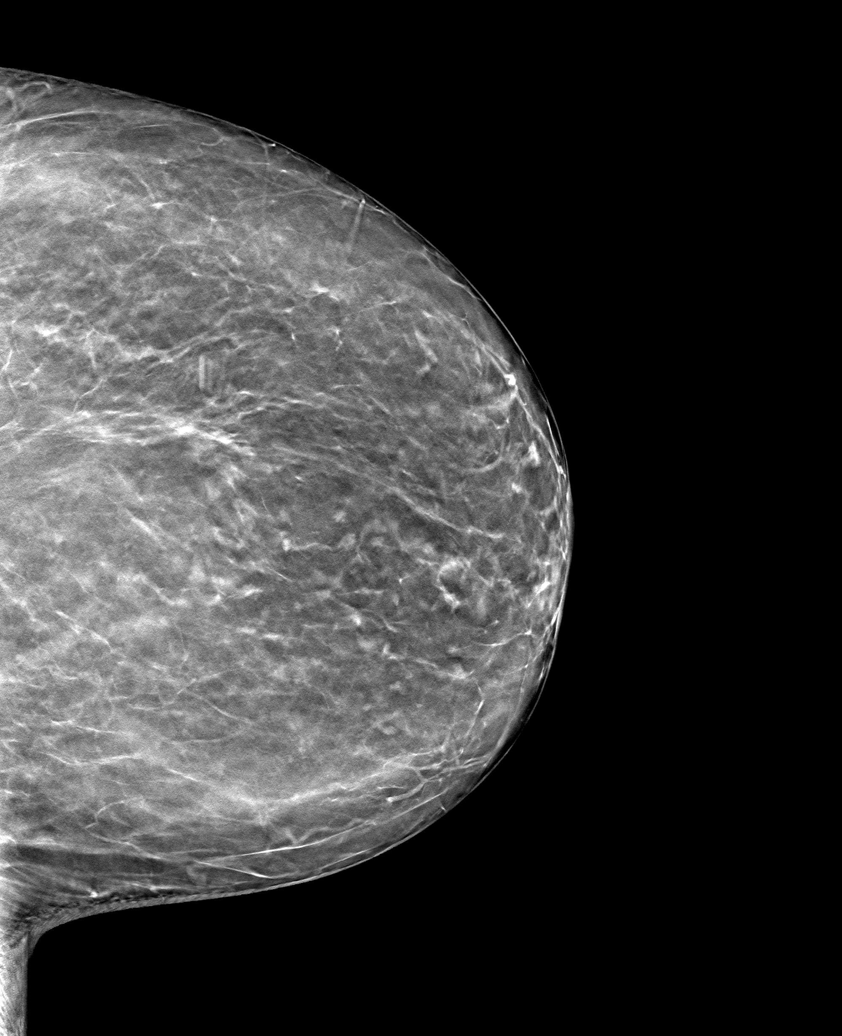

[6 of 30 positions shown; findings below may reference images not displayed]

ACR Breast Density Category c: The breast tissue is heterogeneously
dense, which may obscure small masses.
FINDINGS: In the right breast, calcifications warrant further evaluation with
magnified views. In the left breast, no findings suspicious for
malignancy. Images were processed with CAD.
IMPRESSION: Further evaluation is suggested for calcifications in the right
breast.

RECOMMENDATION:
Diagnostic mammogram of the right breast. (Code:[id number])

The patient will be contacted regarding the findings, and additional
imaging will be scheduled.

BI-RADS CATEGORY  0: Incomplete. Need additional imaging evaluation
and/or prior mammograms for comparison.

## 2019-08-17 ENCOUNTER — Ambulatory Visit: Payer: Medicare PPO | Admitting: Dermatology

## 2019-11-05 ENCOUNTER — Ambulatory Visit
Admission: RE | Admit: 2019-11-05 | Discharge: 2019-11-05 | Disposition: A | Discharge status: Home / Self Care | Payer: Medicare PPO | Source: Ambulatory Visit | Attending: Obstetrics & Gynecology | Admitting: Obstetrics & Gynecology

## 2019-11-05 ENCOUNTER — Other Ambulatory Visit: Payer: Self-pay

## 2019-11-05 DIAGNOSIS — Z1231 Encounter for screening mammogram for malignant neoplasm of breast: Secondary | ICD-10-CM | POA: Diagnosis not present

## 2019-12-10 ENCOUNTER — Other Ambulatory Visit: Payer: Self-pay

## 2019-12-10 ENCOUNTER — Encounter: Payer: Self-pay | Admitting: Dermatology

## 2019-12-10 ENCOUNTER — Ambulatory Visit: Payer: Medicare PPO | Admitting: Dermatology

## 2019-12-10 DIAGNOSIS — Z86007 Personal history of in-situ neoplasm of skin: Secondary | ICD-10-CM

## 2019-12-10 DIAGNOSIS — L578 Other skin changes due to chronic exposure to nonionizing radiation: Secondary | ICD-10-CM

## 2019-12-10 DIAGNOSIS — L814 Other melanin hyperpigmentation: Secondary | ICD-10-CM

## 2019-12-10 DIAGNOSIS — D229 Melanocytic nevi, unspecified: Secondary | ICD-10-CM

## 2019-12-10 DIAGNOSIS — Z1283 Encounter for screening for malignant neoplasm of skin: Secondary | ICD-10-CM

## 2019-12-10 DIAGNOSIS — L821 Other seborrheic keratosis: Secondary | ICD-10-CM

## 2019-12-10 DIAGNOSIS — Z85828 Personal history of other malignant neoplasm of skin: Secondary | ICD-10-CM | POA: Diagnosis not present

## 2019-12-10 DIAGNOSIS — Z86018 Personal history of other benign neoplasm: Secondary | ICD-10-CM

## 2019-12-10 DIAGNOSIS — D1800 Hemangioma unspecified site: Secondary | ICD-10-CM | POA: Diagnosis not present

## 2019-12-10 NOTE — Progress Notes (Signed)
   Follow-Up Visit   Subjective  Valerie Farmer is a 69 y.o. female who presents for the following: TBSE. The patient presents for Total-Body Skin Exam (TBSE) for skin cancer screening and mole check. Patient presents today for TBSE, patient has no concerns today. Patient has a h/o Mod dysplastic and slight dysplastic nevi, BCC R. Mid forearm 10/16/12 and MMis L inf. Scapula 05/20/14 with excision 3/0/16. Patient also has a h/o psoriasis, uses clobetasol and mometasone cream prn, has not had a flare since May.  The following portions of the chart were reviewed this encounter and updated as appropriate:  Tobacco  Allergies  Meds  Problems  Med Hx  Surg Hx  Fam Hx     Review of Systems:  No other skin or systemic complaints except as noted in HPI or Assessment and Plan.  Objective  Well appearing patient in no apparent distress; mood and affect are within normal limits.  A full examination was performed including scalp, head, eyes, ears, nose, lips, neck, chest, axillae, abdomen, back, buttocks, bilateral upper extremities, bilateral lower extremities, hands, feet, fingers, toes, fingernails, and toenails. All findings within normal limits unless otherwise noted below.    Assessment & Plan    Lentigines - Scattered tan macules - Discussed due to sun exposure - Benign, observe - Call for any changes  Seborrheic Keratoses - Stuck-on, waxy, tan-brown papules and plaques  - Discussed benign etiology and prognosis. - Observe - Call for any changes  Melanocytic Nevi - Tan-brown and/or pink-flesh-colored symmetric macules and papules - Benign appearing on exam today - Observation - Call clinic for new or changing moles - Recommend daily use of broad spectrum spf 30+ sunscreen to sun-exposed areas.   Hemangiomas - Red papules - Discussed benign nature - Observe - Call for any changes  Actinic Damage - diffuse scaly erythematous macules with underlying dyspigmentation -  Recommend daily broad spectrum sunscreen SPF 30+ to sun-exposed areas, reapply every 2 hours as needed.  - Call for new or changing lesions.  Skin cancer screening performed today.  History of Basal Cell Carcinoma of the Skin R. Mid forearm 10/16/12 - No evidence of recurrence today - Recommend regular full body skin exams - Recommend daily broad spectrum sunscreen SPF 30+ to sun-exposed areas, reapply every 2 hours as needed.  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  - Call if any new or changing lesions are noted between office visits  History of Dysplastic Nevi L mid upper back(moderate) 09/05/17, Left calf R. Dosrum foot (Moderate) 10/04/07, RUQA (slight) , R Epigastric (slight to mod) 05/28/08  - No evidence of recurrence today - Recommend regular full body skin exams - Recommend daily broad spectrum sunscreen SPF 30+ to sun-exposed areas, reapply every 2 hours as needed.  - Call if any new or changing lesions are noted between office visits   Return in about 1 year (around 12/09/2020) for TBSE.  I, Donzetta Kohut, CMA, am acting as scribe for Sarina Ser, MD . Documentation: I have reviewed the above documentation for accuracy and completeness, and I agree with the above.  Sarina Ser, MD

## 2019-12-10 NOTE — Patient Instructions (Addendum)
Recommend daily broad spectrum sunscreen SPF 30+ to sun-exposed areas, reapply every 2 hours as needed. Call for new or changing lesions.  

## 2020-02-27 ENCOUNTER — Other Ambulatory Visit: Payer: Self-pay | Admitting: Dermatology

## 2020-03-13 ENCOUNTER — Ambulatory Visit: Payer: Medicare PPO | Admitting: Podiatry

## 2020-03-13 ENCOUNTER — Encounter: Payer: Self-pay | Admitting: Podiatry

## 2020-03-13 ENCOUNTER — Other Ambulatory Visit: Payer: Self-pay

## 2020-03-13 DIAGNOSIS — L84 Corns and callosities: Secondary | ICD-10-CM

## 2020-03-13 DIAGNOSIS — M2042 Other hammer toe(s) (acquired), left foot: Secondary | ICD-10-CM

## 2020-03-13 DIAGNOSIS — E119 Type 2 diabetes mellitus without complications: Secondary | ICD-10-CM

## 2020-03-13 DIAGNOSIS — M216X9 Other acquired deformities of unspecified foot: Secondary | ICD-10-CM

## 2020-03-13 NOTE — Progress Notes (Signed)
This patient returns to my office for at risk foot care.  This patient requires this care by a professional since this patient will be at risk due to having  Diabetes.  She is scheduled for a visit with Liliane Channel for insoles./shoes.  She presents for diabetic foot exam.  General Appearance  Alert, conversant and in no acute stress.  Vascular  Dorsalis pedis and posterior tibial  pulses are palpable  bilaterally.  Capillary return is within normal limits  bilaterally. Temperature is within normal limits  bilaterally.  Neurologic  Senn-Weinstein monofilament wire test diminished   bilaterally. Muscle power within normal limits bilaterally.  Nails Thick disfigured hallux nails  B/L asymptomatic.   No evidence of bacterial infection or drainage bilaterally.  Orthopedic  No limitations of motion  feet .  No crepitus or effusions noted.  No bony pathology or digital deformities noted.  Plantar flexed fourth metatarsal  B/L.  Skin  normotropic skin with  Porokeratosis sub 4th  B/L and sub 3rd  B/L.   noted bilaterally.  No signs of infections or ulcers noted.     Onychomycosis  Pain in right toes  Pain in left toes  Consent was obtained for treatment procedures.   Mechanical debridement of nails 1-5  bilaterally performed with a nail nipper.  Filed with dremel without incident.    Return office visit     4 weeks with Liliane Channel.                Told patient to return prn.  No evidence of vascular or neurologic pathology.  Patient qualifies for diabetic shoes for DPN and hammer toes    Gardiner Barefoot DPM

## 2020-04-02 ENCOUNTER — Ambulatory Visit: Payer: Medicare PPO | Admitting: Orthotics

## 2020-04-09 ENCOUNTER — Other Ambulatory Visit: Payer: Self-pay

## 2020-04-09 ENCOUNTER — Ambulatory Visit (INDEPENDENT_AMBULATORY_CARE_PROVIDER_SITE_OTHER): Payer: Medicare PPO | Admitting: Orthotics

## 2020-04-09 DIAGNOSIS — E119 Type 2 diabetes mellitus without complications: Secondary | ICD-10-CM

## 2020-06-02 ENCOUNTER — Telehealth: Payer: Self-pay | Admitting: Podiatry

## 2020-06-02 NOTE — Telephone Encounter (Signed)
His is a [facility] pt and she left message asking if we had received the diabetic shoe paperwork.    I returned call and explained that as of now I have not received the diabetic shoe paperwork. She was calling the pcp office again.

## 2020-06-10 ENCOUNTER — Telehealth: Payer: Self-pay | Admitting: Podiatry

## 2020-06-10 NOTE — Telephone Encounter (Signed)
Called and left message earlier for Dr Baldemar Lenis to sign documents for diabetic shoes.  Received vm from [facility] stating they have not gotten it.  I returned call and spoke to her telling her it was mailed last week. And she was checking to see if they have received it yet. Only other possible option would be for pt to pick it up in [place] office and take to Dr Elzie Rings office.

## 2020-06-17 ENCOUNTER — Telehealth: Payer: Self-pay | Admitting: Podiatry

## 2020-06-17 NOTE — Telephone Encounter (Signed)
Pt left message wanting me to call her back to confirm we got the diabetic shoe documents from Dr Baldemar Lenis.   I returned call and told pt we did receive them and her inserts are in production so it should be a few weeks before they come in.

## 2020-07-09 ENCOUNTER — Other Ambulatory Visit: Payer: Medicare PPO

## 2020-07-09 ENCOUNTER — Other Ambulatory Visit: Payer: Self-pay

## 2020-07-09 DIAGNOSIS — M216X1 Other acquired deformities of right foot: Secondary | ICD-10-CM

## 2020-07-09 DIAGNOSIS — M2042 Other hammer toe(s) (acquired), left foot: Secondary | ICD-10-CM

## 2020-07-09 DIAGNOSIS — E119 Type 2 diabetes mellitus without complications: Secondary | ICD-10-CM

## 2020-07-18 ENCOUNTER — Ambulatory Visit: Payer: Medicare PPO

## 2020-09-23 ENCOUNTER — Other Ambulatory Visit: Payer: Self-pay | Admitting: Obstetrics & Gynecology

## 2020-09-23 ENCOUNTER — Telehealth: Payer: Self-pay

## 2020-09-23 DIAGNOSIS — Z1231 Encounter for screening mammogram for malignant neoplasm of breast: Secondary | ICD-10-CM

## 2020-09-23 NOTE — Telephone Encounter (Signed)
Ordered

## 2020-09-23 NOTE — Telephone Encounter (Signed)
Pt calling; has appt 7/22 c PH; needs appt for mammogram at [facility]; they told her she needed a referral from [facility] before it could be scheduled.  [phone]

## 2020-09-24 NOTE — Telephone Encounter (Signed)
Pt aware.

## 2020-10-01 NOTE — Progress Notes (Signed)
Patient in office today and seen by EJ with [facility] for diabetic shoe measurements. Patient's diabetes is being treated by Dr. Baldemar Lenis at this time. A shoe selection was made and the patient was advised that the office will call when the shoes are available for pick-up. Patient verbalized understanding.

## 2020-10-17 ENCOUNTER — Encounter: Payer: Self-pay | Admitting: Obstetrics & Gynecology

## 2020-10-17 ENCOUNTER — Ambulatory Visit (INDEPENDENT_AMBULATORY_CARE_PROVIDER_SITE_OTHER): Payer: Medicare PPO | Admitting: Obstetrics & Gynecology

## 2020-10-17 ENCOUNTER — Other Ambulatory Visit (HOSPITAL_COMMUNITY)
Admission: RE | Admit: 2020-10-17 | Discharge: 2020-10-17 | Disposition: A | Discharge status: Home / Self Care | Payer: Medicare PPO | Source: Ambulatory Visit | Attending: Obstetrics & Gynecology | Admitting: Obstetrics & Gynecology

## 2020-10-17 ENCOUNTER — Other Ambulatory Visit: Payer: Self-pay

## 2020-10-17 VITALS — BP 120/80 | Ht 62.0 in | Wt 178.0 lb

## 2020-10-17 DIAGNOSIS — Z1272 Encounter for screening for malignant neoplasm of vagina: Secondary | ICD-10-CM

## 2020-10-17 DIAGNOSIS — Z01419 Encounter for gynecological examination (general) (routine) without abnormal findings: Secondary | ICD-10-CM

## 2020-10-17 DIAGNOSIS — Z124 Encounter for screening for malignant neoplasm of cervix: Secondary | ICD-10-CM

## 2020-10-17 NOTE — Progress Notes (Signed)
HPI:      [name] is a 70 y.o. G0P0000 who LMP was in the past, she presents today for her annual examination.  The patient has no complaints today. The patient is not sexually active. Herlast pap: approximate date 2017 and was normal and last mammogram: approximate date 2021 and was normal.  The patient does perform self breast exams.  There is notable family history of breast or ovarian cancer in her family. The patient is not taking hormone replacement therapy. Patient denies post-menopausal vaginal bleeding.   The patient has regular exercise: yes. The patient denies current symptoms of depression.    GYN Hx: Last Colonoscopy:5 years ago. Normal.  Last DEXA: year ago.    PMHx: Past Medical History:  Diagnosis Date   Anxiety    Basal cell carcinoma 10/16/2012   Right mid forearm. Superficial.    Diabetes ([place])    type 2   Dysplastic nevus 09/06/2007   Left mid upper back paraspinal. Moderate atypia, margins involved.   Dysplastic nevus 10/04/2007   Left lat. calf. Slight to moderate atypia, close to margin.   Dysplastic nevus 10/04/2007   Right dorsum of foot. Moderate to marked atypia, close to margin. Excised 11/29/2007, margins free.   Dysplastic nevus 05/28/2008   RUQ abdomen. Slight atypia, limited margins free.   Dysplastic nevus 05/28/2008   Right epigastric. Slight to moderate atypia, close to margin.   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 MIS arising in a dysplastic nevus, lateral margin involved. Excised 05/28/2014, margins free.   Pancreatitis 2005   Psoriasis    Skin cancer    Swelling    Past Surgical History:  Procedure Laterality Date   BREAST BIOPSY Right    BREAST BIOPSY Right 05/25/2019   Affirm bx-"X" clip-path pending   BREAST CYST ASPIRATION Left 2015   BREAST SURGERY Left 09/05/2013   FNA: Comments: LEFT BREAST   BREAST SURGERY Right 1990   CATARACT  EXTRACTION W/PHACO Left 09/13/2018   Procedure: CATARACT EXTRACTION PHACO AND INTRAOCULAR LENS PLACEMENT ([place]) LEFT DIABETES  panoptic lens;  Surgeon: Leandrew Koyanagi, MD;  Location: [place];  Service: Ophthalmology;  Laterality: Left;  Diabetic - oral meds   CATARACT EXTRACTION W/PHACO Right 10/11/2018   Procedure: CATARACT EXTRACTION PHACO AND INTRAOCULAR LENS PLACEMENT ([place])  RIGHT DIABETIC PANOPTIX LENS;  Surgeon: Leandrew Koyanagi, MD;  Location: [place];  Service: Ophthalmology;  Laterality: Right;  DIABETIC   CHOLECYSTECTOMY  2005   COLONOSCOPY  2007   DR. Byrnett, normal study   COLONOSCOPY WITH PROPOFOL N/A 11/10/2015   Procedure: COLONOSCOPY WITH PROPOFOL;  Surgeon: Joriel Streety Bellow, MD;  Location: [facility] ENDOSCOPY;  Service: Endoscopy;  Laterality: N/A;   VAGINAL HYSTERECTOMY  2005   Family History  Problem Relation Age of Onset   Bladder Cancer Father    Breast cancer Paternal Aunt    Leukemia Maternal Aunt    Bladder Cancer Maternal Uncle    Social History   Tobacco Use   Smoking status: Never   Smokeless tobacco: Never  Vaping Use   Vaping Use: Never used  Substance Use Topics   Alcohol use: No   Drug use: No    Current Outpatient Medications:    ALPRAZolam (XANAX) 0.25 MG tablet, Take by mouth at bedtime as needed. Reported on 10/01/2015, Disp: , Rfl:    aspirin EC 81  MG tablet, Take 81 mg by mouth daily. , Disp: , Rfl:    atorvastatin (LIPITOR) 20 MG tablet, Take 20 mg by mouth daily. , Disp: , Rfl:    calcium-vitamin D (OSCAL WITH D) 500-200 MG-UNIT TABS tablet, Take by mouth., Disp: , Rfl:    clobetasol cream (TEMOVATE) [id number] %, Apply 1 application topically 2 (two) times daily., Disp: , Rfl:    escitalopram (LEXAPRO) 10 MG tablet, Take 10 mg by mouth. Take 1 1/2 tabs daily, Disp: , Rfl:    glipiZIDE (GLUCOTROL) 10 MG tablet, Take 5 mg by mouth daily before breakfast. , Disp: , Rfl:    hydrochlorothiazide (HYDRODIURIL) 12.5 MG tablet,  Take 12.5 mg by mouth daily., Disp: , Rfl:    ketoconazole (NIZORAL) 2 % cream, APPLY TO SKIN 1 TO 2 TIMES DAILY, Disp: 60 g, Rfl: 2   losartan (COZAAR) 50 MG tablet, Take 50 mg by mouth daily., Disp: , Rfl:    metFORMIN (GLUCOPHAGE-XR) 500 MG 24 hr tablet, , Disp: , Rfl: 0   mometasone (NASONEX) 50 MCG/ACT nasal spray, Place 2 sprays into the nose daily as needed. , Disp: , Rfl:    ONE TOUCH ULTRA TEST test strip, , Disp: , Rfl:    Probiotic Product (ALIGN PO), Take 1 tablet by mouth daily. , Disp: , Rfl:    sitaGLIPtin (JANUVIA) 25 MG tablet, Take 25 mg by mouth daily., Disp: , Rfl:  Allergies: Patient has no known allergies.  Review of Systems  Constitutional:  Negative for chills, fever and malaise/fatigue.  HENT:  Negative for congestion, sinus pain and sore throat.   Eyes:  Negative for blurred vision and pain.  Respiratory:  Negative for cough and wheezing.   Cardiovascular:  Negative for chest pain and leg swelling.  Gastrointestinal:  Negative for abdominal pain, constipation, diarrhea, heartburn, nausea and vomiting.  Genitourinary:  Negative for dysuria, frequency, hematuria and urgency.  Musculoskeletal:  Negative for back pain, joint pain, myalgias and neck pain.  Skin:  Negative for itching and rash.  Neurological:  Negative for dizziness, tremors and weakness.  Endo/Heme/Allergies:  Does not bruise/bleed easily.  Psychiatric/Behavioral:  Negative for depression. The patient is not nervous/anxious and does not have insomnia.    Objective: BP 120/80   Ht '5\' 2"'$  (1.575 m)   Wt 178 lb (80.7 kg)   BMI 32.56 kg/m   Filed Weights   10/17/20 1522  Weight: 178 lb (80.7 kg)   Body mass index is 32.56 kg/m. Physical Exam Constitutional:      General: She is not in acute distress.    Appearance: She is well-developed.  Genitourinary:     Vulva, bladder, rectum and urethral meatus normal.     No lesions in the vagina.     Genitourinary Comments: Vaginal cuff well healed      Right Labia: No rash, tenderness or lesions.    Left Labia: No tenderness, lesions or rash.    No vaginal bleeding.      Right Adnexa: not tender and no mass present.    Left Adnexa: not tender and no mass present.    Cervix is absent.     Uterus is absent.     Pelvic exam was performed with patient in the lithotomy position.  Breasts:    Right: No mass, skin change or tenderness.     Left: No mass, skin change or tenderness.  HENT:     Head: Normocephalic and atraumatic. No laceration.  Right Ear: Hearing normal.     Left Ear: Hearing normal.     Mouth/Throat:     Pharynx: Uvula midline.  Eyes:     Pupils: Pupils are equal, round, and reactive to light.  Neck:     Thyroid: No thyromegaly.  Cardiovascular:     Rate and Rhythm: Normal rate and regular rhythm.     Heart sounds: No murmur heard.   No friction rub. No gallop.  Pulmonary:     Effort: Pulmonary effort is normal. No respiratory distress.     Breath sounds: Normal breath sounds. No wheezing.  Abdominal:     General: Bowel sounds are normal. There is no distension.     Palpations: Abdomen is soft.     Tenderness: There is no abdominal tenderness. There is no rebound.  Musculoskeletal:        General: Normal range of motion.     Cervical back: Normal range of motion and neck supple.  Neurological:     Mental Status: She is alert and oriented to person, place, and time.     Cranial Nerves: No cranial nerve deficit.  Skin:    General: Skin is warm and dry.  Psychiatric:        Judgment: Judgment normal.  Vitals reviewed.    Assessment: Annual Exam 1. Women's annual routine gynecological examination   2. Screening for vaginal cancer     Plan:            1.  Cervical Screening-  Pap smear done today  2. Breast screening- Exam annually and mammogram scheduled  3. Colonoscopy every 10 years, Hemoccult testing after age 6  4. Labs managed by PCP  5. Counseling for hormonal therapy: none               6. FRAX - FRAX score for assessing the 10 year probability for fracture calculated and discussed today.  Based on age and score today, DEXA is not scheduled.    F/U  Return in about 1 year (around 10/17/2021) for Annual.  Barnett Applebaum, MD, [facility], [facility] 10/17/2020  4:02 PM

## 2020-10-21 LAB — CYTOLOGY - PAP: Diagnosis: NEGATIVE

## 2020-11-11 ENCOUNTER — Other Ambulatory Visit: Payer: Self-pay

## 2020-11-11 ENCOUNTER — Ambulatory Visit
Admission: RE | Admit: 2020-11-11 | Discharge: 2020-11-11 | Disposition: A | Discharge status: Home / Self Care | Payer: Medicare PPO | Source: Ambulatory Visit | Attending: Obstetrics & Gynecology | Admitting: Obstetrics & Gynecology

## 2020-11-11 DIAGNOSIS — Z1231 Encounter for screening mammogram for malignant neoplasm of breast: Secondary | ICD-10-CM | POA: Insufficient documentation

## 2020-12-11 ENCOUNTER — Ambulatory Visit: Payer: Medicare PPO | Admitting: Dermatology

## 2020-12-11 ENCOUNTER — Other Ambulatory Visit: Payer: Self-pay

## 2020-12-11 DIAGNOSIS — Z85828 Personal history of other malignant neoplasm of skin: Secondary | ICD-10-CM

## 2020-12-11 DIAGNOSIS — L821 Other seborrheic keratosis: Secondary | ICD-10-CM

## 2020-12-11 DIAGNOSIS — D229 Melanocytic nevi, unspecified: Secondary | ICD-10-CM

## 2020-12-11 DIAGNOSIS — Z86006 Personal history of melanoma in-situ: Secondary | ICD-10-CM | POA: Diagnosis not present

## 2020-12-11 DIAGNOSIS — Z86018 Personal history of other benign neoplasm: Secondary | ICD-10-CM

## 2020-12-11 DIAGNOSIS — L578 Other skin changes due to chronic exposure to nonionizing radiation: Secondary | ICD-10-CM | POA: Diagnosis not present

## 2020-12-11 DIAGNOSIS — L820 Inflamed seborrheic keratosis: Secondary | ICD-10-CM

## 2020-12-11 DIAGNOSIS — L814 Other melanin hyperpigmentation: Secondary | ICD-10-CM

## 2020-12-11 DIAGNOSIS — Z1283 Encounter for screening for malignant neoplasm of skin: Secondary | ICD-10-CM

## 2020-12-11 DIAGNOSIS — L304 Erythema intertrigo: Secondary | ICD-10-CM

## 2020-12-11 DIAGNOSIS — D1800 Hemangioma unspecified site: Secondary | ICD-10-CM

## 2020-12-11 MED ORDER — KETOCONAZOLE 2 % EX CREA: TOPICAL_CREAM | CUTANEOUS | 11 refills | Status: DC

## 2020-12-11 NOTE — Patient Instructions (Addendum)

## 2020-12-11 NOTE — Progress Notes (Signed)
Follow-Up Visit   Subjective  Valerie Farmer is a 70 y.o. female who presents for the following: Annual Exam (Mole check ). Hx of Melanoma, hx of BCC, Hx of Dysplastic nevus. Pt c/o itchy spots on her body. Refill Ketoconazole cream for rash under her breast. The patient presents for Total-Body Skin Exam (TBSE) for skin cancer screening and mole check.   The following portions of the chart were reviewed this encounter and updated as appropriate:   Tobacco  Allergies  Meds  Problems  Med Hx  Surg Hx  Fam Hx     Review of Systems:  No other skin or systemic complaints except as noted in HPI or Assessment and Plan.  Objective  Well appearing patient in no apparent distress; mood and affect are within normal limits.  A full examination was performed including scalp, head, eyes, ears, nose, lips, neck, chest, axillae, abdomen, back, buttocks, bilateral upper extremities, bilateral lower extremities, hands, feet, fingers, toes, fingernails, and toenails. All findings within normal limits unless otherwise noted below.  left forehead, left flank   (5) (4) Erythematous keratotic or waxy stuck-on papule or plaque.   left inframammary, right inframammary Mild erythema    Assessment & Plan  Inflamed seborrheic keratosis left forehead, left flank   (5)  Destruction of lesion - left forehead, left flank   (5) Complexity: simple   Destruction method: cryotherapy   Informed consent: discussed and consent obtained   Timeout:  patient name, date of birth, surgical site, and procedure verified Lesion destroyed using liquid nitrogen: Yes   Region frozen until ice ball extended beyond lesion: Yes   Outcome: patient tolerated procedure well with no complications   Post-procedure details: wound care instructions given    Intertrigo left inframammary, right inframammary  Intertrigo is a chronic recurrent rash that occurs in skin fold areas that may be associated with friction; heat;  moisture; yeast; fungus; and bacteria.  It is exacerbated by increased movement / activity; sweating; and higher atmospheric temperature.   Cont Ketoconazole 2% cream daily   Related Medications ketoconazole (NIZORAL) 2 % cream APPLY TO SKIN 1 TO 2 TIMES DAILY  Skin cancer screening  Lentigines - Scattered tan macules - Due to sun exposure - Benign-appearing, observe - Recommend daily broad spectrum sunscreen SPF 30+ to sun-exposed areas, reapply every 2 hours as needed. - Call for any changes  Seborrheic Keratoses - Stuck-on, waxy, tan-brown papules and/or plaques  - Benign-appearing - Discussed benign etiology and prognosis. - Observe - Call for any changes  Melanocytic Nevi - Tan-brown and/or pink-flesh-colored symmetric macules and papules - Benign appearing on exam today - Observation - Call clinic for new or changing moles - Recommend daily use of broad spectrum spf 30+ sunscreen to sun-exposed areas.   Hemangiomas - Red papules - Discussed benign nature - Observe - Call for any changes  Actinic Damage - Chronic condition, secondary to cumulative UV/sun exposure - diffuse scaly erythematous macules with underlying dyspigmentation - Recommend daily broad spectrum sunscreen SPF 30+ to sun-exposed areas, reapply every 2 hours as needed.  - Staying in the shade or wearing long sleeves, sun glasses (UVA+UVB protection) and wide brim hats (4-inch brim around the entire circumference of the hat) are also recommended for sun protection.  - Call for new or changing lesions.  History of Melanoma in situ Left inferior scapula 05/20/2014 - No evidence of recurrence today - No lymphadenopathy - Recommend regular full body skin exams - Recommend daily broad  spectrum sunscreen SPF 30+ to sun-exposed areas, reapply every 2 hours as needed.  - Call if any new or changing lesions are noted between office visits   History of Basal Cell Carcinoma of the Skin Right mid forearm  2014 - No evidence of recurrence today - Recommend regular full body skin exams - Recommend daily broad spectrum sunscreen SPF 30+ to sun-exposed areas, reapply every 2 hours as needed.  - Call if any new or changing lesions are noted between office visits   History of Dysplastic Nevi Multiple see history - No evidence of recurrence today - Recommend regular full body skin exams - Recommend daily broad spectrum sunscreen SPF 30+ to sun-exposed areas, reapply every 2 hours as needed.  - Call if any new or changing lesions are noted between office visits   Skin cancer screening performed today.   Return in about 1 year (around 12/11/2021) for TBSE, hx of Melanoma in situ.  IMarye Round, CMA, am acting as scribe for Sarina Ser, MD .  Documentation: I have reviewed the above documentation for accuracy and completeness, and I agree with the above.  Sarina Ser, MD

## 2020-12-14 ENCOUNTER — Encounter: Payer: Self-pay | Admitting: Dermatology

## 2021-01-08 ENCOUNTER — Telehealth: Payer: Self-pay | Admitting: Obstetrics & Gynecology

## 2021-01-08 NOTE — Telephone Encounter (Signed)
Pt called in about her statement from her 7/22 visit with Dr. Kenton Kingfisher.  Said that the visit was coded as "preventive" (I think) so now Medicare will not pay.  She has a bill for over $300.  She wanted to speak with someone who does the coding here.

## 2021-02-26 ENCOUNTER — Encounter: Payer: Self-pay | Admitting: Podiatry

## 2021-02-26 ENCOUNTER — Other Ambulatory Visit: Payer: Self-pay

## 2021-02-26 ENCOUNTER — Ambulatory Visit: Payer: Medicare PPO | Admitting: Podiatry

## 2021-02-26 DIAGNOSIS — M79675 Pain in left toe(s): Secondary | ICD-10-CM

## 2021-02-26 DIAGNOSIS — M79674 Pain in right toe(s): Secondary | ICD-10-CM

## 2021-02-26 DIAGNOSIS — B351 Tinea unguium: Secondary | ICD-10-CM | POA: Insufficient documentation

## 2021-02-26 DIAGNOSIS — E119 Type 2 diabetes mellitus without complications: Secondary | ICD-10-CM | POA: Diagnosis not present

## 2021-02-26 NOTE — Progress Notes (Signed)
This patient returns to my office for at risk foot care.  This patient requires this care by a professional since this patient will be at risk due to having  Diabetes.  She is scheduled for a visit with pedorthistfor insoles./shoes.  She presents for diabetic foot exam.  General Appearance  Alert, conversant and in no acute stress.  Vascular  Dorsalis pedis and posterior tibial  pulses are palpable  bilaterally.  Capillary return is within normal limits  bilaterally. Temperature is within normal limits  bilaterally.  Neurologic  Senn-Weinstein monofilament wire test diminished   bilaterally. Muscle power within normal limits bilaterally.  Nails Thick disfigured hallux nails  B/L asymptomatic.   No evidence of bacterial infection or drainage bilaterally.  Orthopedic  No limitations of motion  feet .  No crepitus or effusions noted.  No bony pathology or digital deformities noted.  Plantar flexed fourth metatarsal  B/L. Hammer toes 2-4  B/L  Skin  normotropic skin with  Porokeratosis sub 4th  B/L and sub 3rd  B/L.   noted bilaterally.  No signs of infections or ulcers noted.     Onychomycosis  Pain in right toes  Pain in left toes  Consent was obtained for treatment procedures.   Mechanical debridement of nails 1-5  bilaterally performed with a nail nipper.  Filed with dremel without incident.    Return office visit     4 weeks with Liliane Channel.                Told patient to return prn.  No evidence of vascular or neurologic pathology.  Patient qualifies for diabetic shoes for DPN and hammer toes  and plantar flexed metatarsals  B/L.   Gardiner Barefoot DPM

## 2021-03-11 ENCOUNTER — Other Ambulatory Visit: Payer: Self-pay

## 2021-03-11 ENCOUNTER — Other Ambulatory Visit: Payer: Medicare PPO

## 2021-03-26 ENCOUNTER — Ambulatory Visit: Payer: Medicare PPO | Admitting: Podiatry

## 2021-06-22 ENCOUNTER — Ambulatory Visit: Payer: Medicare PPO | Admitting: Podiatry

## 2021-07-06 ENCOUNTER — Ambulatory Visit (INDEPENDENT_AMBULATORY_CARE_PROVIDER_SITE_OTHER): Payer: Medicare PPO

## 2021-07-06 DIAGNOSIS — E119 Type 2 diabetes mellitus without complications: Secondary | ICD-10-CM

## 2021-07-06 DIAGNOSIS — L84 Corns and callosities: Secondary | ICD-10-CM

## 2021-07-06 DIAGNOSIS — M2042 Other hammer toe(s) (acquired), left foot: Secondary | ICD-10-CM

## 2021-07-06 NOTE — Progress Notes (Signed)
SITUATION ?Reason for Visit: Fitting of Diabetic [place] ?Patient / Caregiver Report:  Patient is satisfied with fit and function of insoles but feels shoes are too tight. ? ?OBJECTIVE DATA: ?Patient History / Diagnosis:   ?  ICD-10-CM   ?1. Controlled type 2 diabetes mellitus without complication, without long-term current use of insulin (HCC)  E11.9   ?  ?2. Hammer toe of left foot  M20.42   ?  ?3. Corns and callosities  L84   ?  ? ? ?Change in Status:   None ? ?ACTIONS PERFORMED: ?In-Person Delivery, patient was fit with: ?- 3x pair [phone] PDAC approved vacuum formed custom diabetic insoles; [employer]: [id number] ? ?Existing Shoes and insoles were verified for structural integrity and safety. Patient wore shoes and insoles in office. Skin was inspected and free of areas of concern after wearing shoes and inserts. Shoes and inserts fit properly. Patient / Caregiver provided with ferbal instruction and demonstration regarding donning, doffing, wear, care, proper fit, function, purpose, cleaning, and use of shoes and insoles ' and in all related precautions and risks and benefits regarding shoes and insoles. Patient / Caregiver was instructed to wear properly fitting socks with shoes at all times. Patient was also provided with verbal instruction regarding how to report any failures or malfunctions of shoes or inserts, and necessary follow up care. Patient / Caregiver was also instructed to contact physician regarding change in status that may affect function of shoes and inserts.  ? ?Patient / Caregiver verbalized undersatnding of instruction provided. Patient / Caregiver demonstrated independence with proper donning and doffing of shoes and inserts. ? ?Patient approved of shoe model but wants them re-ordered at 8.5W instead of the 47M available today. Return was processed and shoes re-ordered. ? ?PLAN ?Patient to follow with treating physician as recommended. Plan of care was discussed with and agreed upon by  patient and/or caregiver. All questions were answered and concerns addressed. ? ?

## 2021-08-17 ENCOUNTER — Ambulatory Visit: Payer: Medicare PPO

## 2021-08-17 ENCOUNTER — Ambulatory Visit (INDEPENDENT_AMBULATORY_CARE_PROVIDER_SITE_OTHER): Payer: Medicare PPO | Admitting: Podiatry

## 2021-08-17 ENCOUNTER — Encounter: Payer: Self-pay | Admitting: Podiatry

## 2021-08-17 DIAGNOSIS — M2042 Other hammer toe(s) (acquired), left foot: Secondary | ICD-10-CM

## 2021-08-17 DIAGNOSIS — M79675 Pain in left toe(s): Secondary | ICD-10-CM | POA: Diagnosis not present

## 2021-08-17 DIAGNOSIS — M79674 Pain in right toe(s): Secondary | ICD-10-CM | POA: Diagnosis not present

## 2021-08-17 DIAGNOSIS — M216X9 Other acquired deformities of unspecified foot: Secondary | ICD-10-CM

## 2021-08-17 DIAGNOSIS — M2010 Hallux valgus (acquired), unspecified foot: Secondary | ICD-10-CM

## 2021-08-17 DIAGNOSIS — B351 Tinea unguium: Secondary | ICD-10-CM | POA: Diagnosis not present

## 2021-08-17 DIAGNOSIS — E119 Type 2 diabetes mellitus without complications: Secondary | ICD-10-CM

## 2021-08-17 DIAGNOSIS — L84 Corns and callosities: Secondary | ICD-10-CM

## 2021-08-17 NOTE — Progress Notes (Signed)
Patient seen for fitting of diabetic shoes. Fit is excellent and patient is satisfied with this new larger pair. All questions answered and concerns addressed.

## 2021-08-17 NOTE — Progress Notes (Addendum)
This patient returns to my office for at risk foot care.  This patient requires this care by a professional since this patient will be at risk due to having  Diabetes.  She presents to the office for preventative foot care services.  General Appearance  Alert, conversant and in no acute stress.  Vascular  Dorsalis pedis and posterior tibial  pulses are palpable  bilaterally.  Capillary return is within normal limits  bilaterally. Temperature is within normal limits  bilaterally.  Neurologic  Senn-Weinstein monofilament wire test diminished   bilaterally. Muscle power within normal limits bilaterally.  Nails Thick disfigured hallux nails  B/L asymptomatic.   No evidence of bacterial infection or drainage bilaterally.  Pincer right hallux.  Orthopedic  No limitations of motion  feet .  No crepitus or effusions noted.  No bony pathology or digital deformities noted.  Plantar flexed fourth metatarsal  B/L. Hammer toes 2-4  B/L  Skin  normotropic skin with  Porokeratosis sub 4th  B/L and sub 3rd  B/L.   noted bilaterally.  No signs of infections or ulcers noted.     Onychomycosis  Pain in right toes  Pain in left toes  Consent was obtained for treatment procedures.   Mechanical debridement of nails 1-5  bilaterally performed with a nail nipper.  Filed with dremel without incident.  Padding dispensed.   Return office visit     4 months                Told patient to return prn.  No evidence of vascular or neurologic pathology.  Calluses done as a Manufacturing engineer.   Gardiner Barefoot DPM

## 2021-09-16 ENCOUNTER — Ambulatory Visit: Payer: Medicare PPO | Admitting: Advanced Practice Midwife

## 2021-09-16 ENCOUNTER — Encounter: Payer: Self-pay | Admitting: Advanced Practice Midwife

## 2021-09-16 VITALS — BP 130/80 | Ht 61.0 in | Wt 174.0 lb

## 2021-09-16 DIAGNOSIS — L739 Follicular disorder, unspecified: Secondary | ICD-10-CM | POA: Diagnosis not present

## 2021-09-16 NOTE — Progress Notes (Signed)
Patient ID: Valerie Farmer, female   DOB: [birth year], 71 y.o.   MRN: [id number]  Reason for Consult: vaginal bump (Been there since Friday, quarter of a inch in size)   Subjective:  HPI:  Valerie Farmer is a 71 y.o. female being seen for evaluation of vaginal bump. She first noticed a bump last Friday. She describes it as about quarter of an inch size and tender to touch. She denies any drainage from the bump or symptoms of infection. Since then the bump is smaller and not tender.   We discussed likely resolving pimple like bump with recommendations to apply hot compresses and small amount of antibiotic ointment.   Past Medical History:  Diagnosis Date   Anxiety    Basal cell carcinoma 10/16/2012   Right mid forearm. Superficial.    Diabetes ([place])    type 2   Dysplastic nevus 09/06/2007   Left mid upper back paraspinal. Moderate atypia, margins involved.   Dysplastic nevus 10/04/2007   Left lat. calf. Slight to moderate atypia, close to margin.   Dysplastic nevus 10/04/2007   Right dorsum of foot. Moderate to marked atypia, close to margin. Excised 11/29/2007, margins free.   Dysplastic nevus 05/28/2008   RUQ abdomen. Slight atypia, limited margins free.   Dysplastic nevus 05/28/2008   Right epigastric. Slight to moderate atypia, close to margin.   HBP (high blood pressure)    Hyperlipidemia    Hypertension    Melanoma in situ ([place]) 05/20/2014   Left inferior scapula. MIS arising in a dysplastic nevus, lateral margin involved. Excised 05/28/2014, margins free.   Pancreatitis 2005   Psoriasis    Skin cancer    Swelling    Family History  Problem Relation Age of Onset   Bladder Cancer Father    Breast cancer Paternal Aunt    Leukemia Maternal Aunt    Bladder Cancer Maternal Uncle    Past Surgical History:  Procedure Laterality Date   BREAST BIOPSY Right    BREAST BIOPSY Right 05/25/2019   Affirm bx-"X" clip-path pending   BREAST CYST ASPIRATION Left 2015   BREAST  SURGERY Left 09/05/2013   FNA: Comments: LEFT BREAST   BREAST SURGERY Right 1990   CATARACT EXTRACTION W/PHACO Left 09/13/2018   Procedure: CATARACT EXTRACTION PHACO AND INTRAOCULAR LENS PLACEMENT ([place]) LEFT DIABETES  panoptic lens;  Surgeon: Leandrew Koyanagi, MD;  Location: [place];  Service: Ophthalmology;  Laterality: Left;  Diabetic - oral meds   CATARACT EXTRACTION W/PHACO Right 10/11/2018   Procedure: CATARACT EXTRACTION PHACO AND INTRAOCULAR LENS PLACEMENT ([place])  RIGHT DIABETIC PANOPTIX LENS;  Surgeon: Leandrew Koyanagi, MD;  Location: [place];  Service: Ophthalmology;  Laterality: Right;  DIABETIC   CHOLECYSTECTOMY  2005   COLONOSCOPY  2007   DR. Byrnett, normal study   COLONOSCOPY WITH PROPOFOL N/A 11/10/2015   Procedure: COLONOSCOPY WITH PROPOFOL;  Surgeon: Robert Bellow, MD;  [facility] ENDOSCOPY;  Service: Endoscopy;  Laterality: N/A;   VAGINAL HYSTERECTOMY  2005    Short Social History:  Social History   Tobacco Use   Smoking status: Never   Smokeless tobacco: Never  Substance Use Topics   Alcohol use: No    No Known Allergies  Current Outpatient Medications  Medication Sig Dispense Refill   ALPRAZolam (XANAX) 0.25 MG tablet Take by mouth at bedtime as needed. Reported on 10/01/2015     aspirin EC 81 MG tablet Take 81 mg by mouth daily.  atorvastatin (LIPITOR) 20 MG tablet Take 20 mg by mouth daily.      calcium-vitamin D (OSCAL WITH D) 500-200 MG-UNIT TABS tablet Take by mouth.     clobetasol cream (TEMOVATE) 7.40 % Apply 1 application topically 2 (two) times daily.     escitalopram (LEXAPRO) 10 MG tablet Take 10 mg by mouth. Take 1 1/2 tabs daily     glipiZIDE (GLUCOTROL) 10 MG tablet Take 5 mg by mouth daily before breakfast.      hydrochlorothiazide (HYDRODIURIL) 12.5 MG tablet Take 12.5 mg by mouth daily.     ketoconazole (NIZORAL) 2 % cream APPLY TO SKIN 1 TO 2 TIMES DAILY 60 g 11   losartan (COZAAR) 50 MG tablet Take 50  mg by mouth daily.     metFORMIN (GLUCOPHAGE-XR) 500 MG 24 hr tablet   0   mometasone (NASONEX) 50 MCG/ACT nasal spray Place 2 sprays into the nose daily as needed.      ONE TOUCH ULTRA TEST test strip      Probiotic Product (ALIGN PO) Take 1 tablet by mouth daily.      sitaGLIPtin (JANUVIA) 25 MG tablet Take 25 mg by mouth daily.     No current facility-administered medications for this visit.    Review of Systems  Constitutional:  Negative for chills and fever.  HENT:  Negative for congestion, ear discharge, ear pain, hearing loss, sinus pain and sore throat.   Eyes:  Negative for blurred vision and double vision.  Respiratory:  Negative for cough, shortness of breath and wheezing.   Cardiovascular:  Negative for chest pain, palpitations and leg swelling.  Gastrointestinal:  Negative for abdominal pain, blood in stool, constipation, diarrhea, heartburn, melena, nausea and vomiting.  Genitourinary:  Negative for dysuria, flank pain, frequency, hematuria and urgency.       Positive for vaginal bump  Musculoskeletal:  Negative for back pain, joint pain and myalgias.  Skin:  Negative for itching and rash.  Neurological:  Negative for dizziness, tingling, tremors, sensory change, speech change, focal weakness, seizures, loss of consciousness, weakness and headaches.  Endo/Heme/Allergies:  Negative for environmental allergies. Does not bruise/bleed easily.  Psychiatric/Behavioral:  Negative for depression, hallucinations, memory loss, substance abuse and suicidal ideas. The patient is not nervous/anxious and does not have insomnia.         Objective:  Objective   Vitals:   09/16/21 1034  BP: 130/80  Weight: 174 lb (78.9 kg)  Height: '5\' 1"'$  (1.549 m)   Body mass index is 32.88 kg/m. Constitutional: Well nourished, well developed female in no acute distress.  HEENT: normal Skin: Warm and dry.  Extremity: no edema Respiratory: Normal respiratory effort Neuro: DTRs 2+, Cranial  nerves grossly intact Psych: Alert and Oriented x3. No memory deficits. Normal mood and affect.   Pelvic exam:  is not limited by body habitus EGBUS: small 0.5 cm red bump with pinpoint pimple like head on right buttock. Palpates as nearly flat/no depth. Non-tender to palpation.  Vagina: within normal limits and with normal mucosa   Time spent in care and consultation with patient: 15 minutes   Assessment/Plan:     71 y.o. with resolving folliculitis  Apply warm compresses and small amount of topical antibiotic to affected area Follow up as needed   [facility] 09/16/2021, 1:27 PM

## 2021-09-25 ENCOUNTER — Telehealth: Payer: Self-pay

## 2021-09-25 ENCOUNTER — Encounter: Payer: Self-pay | Admitting: Advanced Practice Midwife

## 2021-09-25 ENCOUNTER — Other Ambulatory Visit: Payer: Self-pay | Admitting: Advanced Practice Midwife

## 2021-09-25 DIAGNOSIS — L739 Follicular disorder, unspecified: Secondary | ICD-10-CM

## 2021-09-25 MED ORDER — CEPHALEXIN 500 MG PO CAPS: 500.0000 mg | ORAL_CAPSULE | Freq: Two times a day (BID) | ORAL | 0 refills | Status: DC

## 2021-09-25 NOTE — Progress Notes (Signed)
Keflex Rx sent to patient pharmacy to treat folliculitis

## 2021-09-25 NOTE — Telephone Encounter (Signed)
Patient contacted our office stating that she was seen by Rod Can on 09/16/21 for a "bump." According to office note patient was diagnosed with folliculitis and advised to apply warm compress and topical antibiotic to affected area. According to message patient states that Opal Sidles told her that if area did not clear in a week to call office back for a antibiotic to be sent to pharmacy. Patient sates that she would like antibiotic sent into [facility] [address] pharmacy. Please review chart and advise. KW

## 2021-11-17 ENCOUNTER — Ambulatory Visit (INDEPENDENT_AMBULATORY_CARE_PROVIDER_SITE_OTHER): Payer: Medicare PPO | Admitting: Advanced Practice Midwife

## 2021-11-17 ENCOUNTER — Encounter: Payer: Self-pay | Admitting: Advanced Practice Midwife

## 2021-11-17 VITALS — BP 120/80 | Ht 61.0 in | Wt 173.0 lb

## 2021-11-17 DIAGNOSIS — Z1231 Encounter for screening mammogram for malignant neoplasm of breast: Secondary | ICD-10-CM | POA: Diagnosis not present

## 2021-11-21 ENCOUNTER — Encounter: Payer: Self-pay | Admitting: Advanced Practice Midwife

## 2021-11-21 NOTE — Progress Notes (Signed)
Patient ID: Valerie Farmer, female   DOB: 04/17/1950, 71 y.o.   MRN: [id number]  Reason for Consult: No chief complaint on file.   Subjective:  Date of Service: 11/17/2021  HPI:  Valerie Farmer is a 71 y.o. female being seen for clinical breast screening exam in preparation for annual mammogram. She has no breast or gyn concerns today. Recent folliculitis cleared up a few weeks after 09/16/21 visit.  Past Medical History:  Diagnosis Date   Anxiety    Basal cell carcinoma 10/16/2012   Right mid forearm. Superficial.    Diabetes ([place])    type 2   Dysplastic nevus 09/06/2007   Left mid upper back paraspinal. Moderate atypia, margins involved.   Dysplastic nevus 10/04/2007   Left lat. calf. Slight to moderate atypia, close to margin.   Dysplastic nevus 10/04/2007   Right dorsum of foot. Moderate to marked atypia, close to margin. Excised 11/29/2007, margins free.   Dysplastic nevus 05/28/2008   RUQ abdomen. Slight atypia, limited margins free.   Dysplastic nevus 05/28/2008   Right epigastric. Slight to moderate atypia, close to margin.   HBP (high blood pressure)    Hyperlipidemia    Hypertension    Melanoma in situ ([place]) 05/20/2014   Left inferior scapula. MIS arising in a dysplastic nevus, lateral margin involved. Excised 05/28/2014, margins free.   Pancreatitis 2005   Psoriasis    Skin cancer    Swelling    Family History  Problem Relation Age of Onset   Bladder Cancer Father    Breast cancer Paternal Aunt    Leukemia Maternal Aunt    Bladder Cancer Maternal Uncle    Past Surgical History:  Procedure Laterality Date   BREAST BIOPSY Right    BREAST BIOPSY Right 05/25/2019   Affirm bx-"X" clip-path pending   BREAST CYST ASPIRATION Left 2015   BREAST SURGERY Left 09/05/2013   FNA: Comments: LEFT BREAST   BREAST SURGERY Right 1990   CATARACT EXTRACTION W/PHACO Left 09/13/2018   Procedure: CATARACT EXTRACTION PHACO AND INTRAOCULAR LENS PLACEMENT ([place]) LEFT DIABETES   panoptic lens;  Surgeon: Leandrew Koyanagi, MD;  Location: [place];  Service: Ophthalmology;  Laterality: Left;  Diabetic - oral meds   CATARACT EXTRACTION W/PHACO Right 10/11/2018   Procedure: CATARACT EXTRACTION PHACO AND INTRAOCULAR LENS PLACEMENT ([place])  RIGHT DIABETIC PANOPTIX LENS;  Surgeon: Leandrew Koyanagi, MD;  Location: [place];  Service: Ophthalmology;  Laterality: Right;  DIABETIC   CHOLECYSTECTOMY  2005   COLONOSCOPY  2007   DR. Byrnett, normal study   COLONOSCOPY WITH PROPOFOL N/A 11/10/2015   Procedure: COLONOSCOPY WITH PROPOFOL;  Surgeon: Robert Bellow, MD;  Location[facility] ENDOSCOPY;  Service: Endoscopy;  Laterality: N/A;   VAGINAL HYSTERECTOMY  2005    Short Social History:  Social History   Tobacco Use   Smoking status: Never   Smokeless tobacco: Never  Substance Use Topics   Alcohol use: No    No Known Allergies  Current Outpatient Medications  Medication Sig Dispense Refill   aspirin EC 81 MG tablet Take 81 mg by mouth daily.      atorvastatin (LIPITOR) 20 MG tablet Take 20 mg by mouth daily.      Calcium Carb-Cholecalciferol (OYSTER SHELL CALCIUM W/D) 500-5 MG-MCG TABS Take 1 tablet by mouth daily.     calcium-vitamin D (OSCAL WITH D) 500-200 MG-UNIT TABS tablet Take by mouth.     clobetasol cream (TEMOVATE) 4.78 % Apply 1 application topically  2 (two) times daily.     escitalopram (LEXAPRO) 10 MG tablet Take 10 mg by mouth. Take 1 1/2 tabs daily     glipiZIDE (GLUCOTROL XL) 10 MG 24 hr tablet Take 1 tablet by mouth daily.     hydrochlorothiazide (HYDRODIURIL) 12.5 MG tablet Take 12.5 mg by mouth daily.     ketoconazole (NIZORAL) 2 % cream APPLY TO SKIN 1 TO 2 TIMES DAILY 60 g 11   losartan (COZAAR) 50 MG tablet Take 50 mg by mouth daily.     metFORMIN (GLUCOPHAGE-XR) 500 MG 24 hr tablet   0   mometasone (NASONEX) 50 MCG/ACT nasal spray Place 2 sprays into the nose daily as needed.      ONE TOUCH ULTRA TEST test strip       Probiotic Product (ALIGN PO) Take 1 tablet by mouth daily.      sitaGLIPtin (JANUVIA) 100 MG tablet TAKE 1 TABLET(100 MG) BY MOUTH EVERY DAY     sitaGLIPtin (JANUVIA) 25 MG tablet Take 25 mg by mouth daily.     ALPRAZolam (XANAX) 0.25 MG tablet Take by mouth at bedtime as needed. Reported on 10/01/2015 (Patient not taking: Reported on 11/17/2021)     Cysteamine Bitartrate (PROCYSBI) 300 MG PACK by XX route as directed (Patient not taking: Reported on 11/17/2021)     Influenza vac split quadrivalent PF (FLUZONE HIGH-DOSE) 0.5 ML injection TO BE ADMINISTERED BY PHARMACIST FOR IMMUNIZATION (Patient not taking: Reported on 11/17/2021)     losartan-hydrochlorothiazide (HYZAAR) 50-12.5 MG tablet TK 1 T PO QD (Patient not taking: Reported on 11/17/2021)     No current facility-administered medications for this visit.    Review of Systems  Constitutional:  Negative for chills and fever.  HENT:  Negative for congestion, ear discharge, ear pain, hearing loss, sinus pain and sore throat.   Eyes:  Negative for blurred vision and double vision.  Respiratory:  Negative for cough, shortness of breath and wheezing.   Cardiovascular:  Negative for chest pain, palpitations and leg swelling.  Gastrointestinal:  Negative for abdominal pain, blood in stool, constipation, diarrhea, heartburn, melena, nausea and vomiting.  Genitourinary:  Negative for dysuria, flank pain, frequency, hematuria and urgency.  Musculoskeletal:  Negative for back pain, joint pain and myalgias.  Skin:  Negative for itching and rash.  Neurological:  Negative for dizziness, tingling, tremors, sensory change, speech change, focal weakness, seizures, loss of consciousness, weakness and headaches.  Endo/Heme/Allergies:  Negative for environmental allergies. Does not bruise/bleed easily.  Psychiatric/Behavioral:  Negative for depression, hallucinations, memory loss, substance abuse and suicidal ideas. The patient is not nervous/anxious and does not  have insomnia.         Objective:  Objective   Vitals:   11/17/21 0909  BP: 120/80  Weight: 173 lb (78.5 kg)  Height: '5\' 1"'$  (1.549 m)   Body mass index is 32.69 kg/m. Constitutional: Well nourished, well developed female in no acute distress.  HEENT: normal Skin: Warm and dry.  Breast: symmetrical, non-tender, no masses or skin changes Cardiovascular: Regular rate and rhythm.   Extremity:  no edema   Respiratory: Clear to auscultation bilateral. Normal respiratory effort Psych: Alert and Oriented x3. No memory deficits. Normal mood and affect.     Assessment/Plan:     71 y.o. G0 P0 postmenopausal female with normal clinical breast exam  Screening mammogram ordered Return to clinic in 1 year for [facility] 11/21/2021, 8:51  AM

## 2021-12-16 ENCOUNTER — Ambulatory Visit: Payer: Medicare PPO | Admitting: Dermatology

## 2021-12-16 ENCOUNTER — Encounter: Payer: Self-pay | Admitting: Dermatology

## 2021-12-16 DIAGNOSIS — Z1283 Encounter for screening for malignant neoplasm of skin: Secondary | ICD-10-CM

## 2021-12-16 DIAGNOSIS — L570 Actinic keratosis: Secondary | ICD-10-CM

## 2021-12-16 DIAGNOSIS — L814 Other melanin hyperpigmentation: Secondary | ICD-10-CM

## 2021-12-16 DIAGNOSIS — L409 Psoriasis, unspecified: Secondary | ICD-10-CM | POA: Diagnosis not present

## 2021-12-16 DIAGNOSIS — L821 Other seborrheic keratosis: Secondary | ICD-10-CM

## 2021-12-16 DIAGNOSIS — D229 Melanocytic nevi, unspecified: Secondary | ICD-10-CM

## 2021-12-16 DIAGNOSIS — L578 Other skin changes due to chronic exposure to nonionizing radiation: Secondary | ICD-10-CM

## 2021-12-16 DIAGNOSIS — Z86018 Personal history of other benign neoplasm: Secondary | ICD-10-CM

## 2021-12-16 DIAGNOSIS — D1801 Hemangioma of skin and subcutaneous tissue: Secondary | ICD-10-CM

## 2021-12-16 DIAGNOSIS — Z86006 Personal history of melanoma in-situ: Secondary | ICD-10-CM

## 2021-12-16 NOTE — Patient Instructions (Addendum)
Cryotherapy Aftercare  Wash gently with soap and water everyday.   Apply Vaseline and Band-Aid daily until healed.     Due to recent changes in healthcare laws, you may see results of your pathology and/or laboratory studies on MyChart before the doctors have had a chance to review them. We understand that in some cases there may be results that are confusing or concerning to you. Please understand that not all results are received at the same time and often the doctors may need to interpret multiple results in order to provide you with the best plan of care or course of treatment. Therefore, we ask that you please give us 2 business days to thoroughly review all your results before contacting the office for clarification. Should we see a critical lab result, you will be contacted sooner.   If You Need Anything After Your Visit  If you have any questions or concerns for your doctor, please call our main line at [phone] and press option 4 to reach your doctor's medical assistant. If no one answers, please leave a voicemail as directed and we will return your call as soon as possible. Messages left after 4 pm will be answered the following business day.   You may also send us a message via MyChart. We typically respond to MyChart messages within 1-2 business days.  For prescription refills, please ask your pharmacy to contact our office. Our fax number is [phone].  If you have an urgent issue when the clinic is closed that cannot wait until the next business day, you can page your doctor at the number below.    Please note that while we do our best to be available for urgent issues outside of office hours, we are not available 24/7.   If you have an urgent issue and are unable to reach us, you may choose to seek medical care at your doctor's office, retail clinic, urgent care center, or emergency room.  If you have a medical emergency, please immediately call 911 or go to the  emergency department.  Pager Numbers  - Dr. Kowalski: [phone]  - Dr. Moye: [phone]  - Dr. Stewart: [phone]  In the event of inclement weather, please call our main line at [phone] for an update on the status of any delays or closures.  Dermatology Medication Tips: Please keep the boxes that topical medications come in in order to help keep track of the instructions about where and how to use these. Pharmacies typically print the medication instructions only on the boxes and not directly on the medication tubes.   If your medication is too expensive, please contact our office at [phone] option 4 or send us a message through MyChart.   We are unable to tell what your co-pay for medications will be in advance as this is different depending on your insurance coverage. However, we may be able to find a substitute medication at lower cost or fill out paperwork to get insurance to cover a needed medication.   If a prior authorization is required to get your medication covered by your insurance company, please allow us 1-2 business days to complete this process.  Drug prices often vary depending on where the prescription is filled and some pharmacies may offer cheaper prices.  The website www.goodrx.com contains coupons for medications through different pharmacies. The prices here do not account for what the cost may be with help from insurance (it may be cheaper with your insurance), but the website can   give you the price if you did not use any insurance.  - You can print the associated coupon and take it with your prescription to the pharmacy.  - You may also stop by our office during regular business hours and pick up a GoodRx coupon card.  - If you need your prescription sent electronically to a different pharmacy, notify our office through [facility] MyChart or by phone at [phone] option 4.     Si Usted Necesita Algo Despus de Su Visita  Tambin puede  enviarnos un mensaje a travs de MyChart. Por lo general respondemos a los mensajes de MyChart en el transcurso de 1 a 2 das hbiles.  Para renovar recetas, por favor pida a su farmacia que se ponga en contacto con nuestra oficina. Nuestro nmero de fax es el [phone].  Si tiene un asunto urgente cuando la clnica est cerrada y que no puede esperar hasta el siguiente da hbil, puede llamar/localizar a su doctor(a) al nmero que aparece a continuacin.   Por favor, tenga en cuenta que aunque hacemos todo lo posible para estar disponibles para asuntos urgentes fuera del horario de oficina, no estamos disponibles las 24 horas del da, los 7 das de la semana.   Si tiene un problema urgente y no puede comunicarse con nosotros, puede optar por buscar atencin mdica  en el consultorio de su doctor(a), en una clnica privada, en un centro de atencin urgente o en una sala de emergencias.  Si tiene una emergencia mdica, por favor llame inmediatamente al 911 o vaya a la sala de emergencias.  Nmeros de bper  - Dr. Kowalski: [phone]  - Dra. Moye: [phone]  - Dra. Stewart: [phone]  En caso de inclemencias del tiempo, por favor llame a nuestra lnea principal al [phone] para una actualizacin sobre el estado de cualquier retraso o cierre.  Consejos para la medicacin en dermatologa: Por favor, guarde las cajas en las que vienen los medicamentos de uso tpico para ayudarle a seguir las instrucciones sobre dnde y cmo usarlos. Las farmacias generalmente imprimen las instrucciones del medicamento slo en las cajas y no directamente en los tubos del medicamento.   Si su medicamento es muy caro, por favor, pngase en contacto con nuestra oficina llamando al [phone] y presione la opcin 4 o envenos un mensaje a travs de MyChart.   No podemos decirle cul ser su copago por los medicamentos por adelantado ya que esto es diferente dependiendo de la cobertura de su seguro.  Sin embargo, es posible que podamos encontrar un medicamento sustituto a menor costo o llenar un formulario para que el seguro cubra el medicamento que se considera necesario.   Si se requiere una autorizacin previa para que su compaa de seguros cubra su medicamento, por favor permtanos de 1 a 2 das hbiles para completar este proceso.  Los precios de los medicamentos varan con frecuencia dependiendo del lugar de dnde se surte la receta y alguna farmacias pueden ofrecer precios ms baratos.  El sitio web www.goodrx.com tiene cupones para medicamentos de diferentes farmacias. Los precios aqu no tienen en cuenta lo que podra costar con la ayuda del seguro (puede ser ms barato con su seguro), pero el sitio web puede darle el precio si no utiliz ningn seguro.  - Puede imprimir el cupn correspondiente y llevarlo con su receta a la farmacia.  - Tambin puede pasar por nuestra oficina durante el horario de atencin regular y recoger una tarjeta de cupones de GoodRx.  -   Si necesita que su receta se enve electrnicamente a una farmacia diferente, informe a nuestra oficina a travs de MyChart de [facility] o por telfono llamando al [phone] y presione la opcin 4.  

## 2021-12-16 NOTE — Progress Notes (Unsigned)
Follow-Up Visit   Subjective  Valerie Farmer is a 71 y.o. 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 The patient presents for Total-Body Skin Exam (TBSE) for skin cancer screening and mole check.  The patient has spots, moles and lesions to be evaluated, some may be new or changing and the patient has concerns that these could be cancer.   The following portions of the chart were reviewed this encounter and updated as appropriate:   Tobacco  Allergies  Meds  Problems  Med Hx  Surg Hx  Fam Hx     Review of Systems:  No other skin or systemic complaints except as noted in HPI or Assessment and Plan.  Objective  Well appearing patient in no apparent distress; mood and affect are within normal limits.  A full examination was performed including scalp, head, eyes, ears, nose, lips, neck, chest, axillae, abdomen, back, buttocks, bilateral upper extremities, bilateral lower extremities, hands, feet, fingers, toes, fingernails, and toenails. All findings within normal limits unless otherwise noted below.  L inf scapula Well healed scar with no evidence of recurrence, no lymphadenopathy.   R nose x 1 Pink scaly macules  R foot sole Hyperkeratosis R foot sole   Assessment & Plan   History of Basal Cell Carcinoma of the Skin - No evidence of recurrence today - Recommend regular full body skin exams - Recommend daily broad spectrum sunscreen SPF 30+ to sun-exposed areas, reapply every 2 hours as needed.  - Call if any new or changing lesions are noted between office visits  - R mid forearm  History of Dysplastic Nevi - No evidence of recurrence today - Recommend regular full body skin exams - Recommend daily broad spectrum sunscreen SPF 30+ to sun-exposed areas, reapply every 2 hours as needed.  -  Call if any new or changing lesions are noted between office visits  - multiple  Lentigines - Scattered tan macules - Due to sun exposure - Benign-appearing, observe - Recommend daily broad spectrum sunscreen SPF 30+ to sun-exposed areas, reapply every 2 hours as needed. - Call for any changes - arms, back  Seborrheic Keratoses - Stuck-on, waxy, tan-brown papules and/or plaques  - Benign-appearing - Discussed benign etiology and prognosis. - Observe - Call for any changes - arms, trunk  Melanocytic Nevi - Tan-brown and/or pink-flesh-colored symmetric macules and papules - Benign appearing on exam today - Observation - Call clinic for new or changing moles - Recommend daily use of broad spectrum spf 30+ sunscreen to sun-exposed areas.  - trunk  Hemangiomas - Red papules - Discussed benign nature - Observe - Call for any changes - trunk  Actinic Damage - Chronic condition, secondary to cumulative UV/sun exposure - diffuse scaly erythematous macules with underlying dyspigmentation - Recommend daily broad spectrum sunscreen SPF 30+ to sun-exposed areas, reapply every 2 hours as needed.  - Staying in the shade or wearing long sleeves, sun glasses (UVA+UVB protection) and wide brim hats (4-inch brim around the entire circumference of the hat) are also recommended for sun protection.  - Call for new or changing lesions.  Purpura - Chronic; persistent and recurrent.  Treatable, but not curable. - Violaceous macules and patches - Benign - Related to trauma, age, sun damage and/or use of blood thinners, chronic use of  topical and/or oral steroids - Observe - Can use OTC arnica containing moisturizer such as Dermend Bruise Formula if desired - Call for worsening or other concerns   Skin cancer screening performed today.   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  No lymphadenopathy.  Observe for recurrence. Call clinic for new or changing lesions.   Recommend regular skin exams, daily broad-spectrum spf 30+ sunscreen use, and photoprotection.    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 Condition is symptomatic / bothersome to patient. Not to goal. Psoriasis is a chronic non-curable, but treatable genetic/hereditary disease that may have other systemic features affecting other organ systems such as joints (Psoriatic Arthritis). It is associated with an increased risk of inflammatory bowel disease, heart disease, non-alcoholic fatty liver disease, and depression.     Cont Clobetasol cr qd up to 5d/wk prn flares, avoid f/g/a,(pt has at home)  Return in about 1 year (around 12/17/2022) for TBSE, Hx of Melanoma IS, Hx of BCC, Hx of Dysplastic nevi, Hx of AKs.  I, Othelia Pulling, RMA, am acting as scribe for Sarina Ser, MD . Documentation: I have reviewed the above documentation for accuracy and completeness, and I agree with the above.  Sarina Ser, MD

## 2021-12-17 ENCOUNTER — Encounter: Payer: Self-pay | Admitting: Dermatology

## 2021-12-22 ENCOUNTER — Ambulatory Visit
Admission: RE | Admit: 2021-12-22 | Discharge: 2021-12-22 | Disposition: A | Discharge status: Home / Self Care | Payer: Medicare PPO | Source: Ambulatory Visit | Attending: Advanced Practice Midwife | Admitting: Advanced Practice Midwife

## 2021-12-22 DIAGNOSIS — Z1231 Encounter for screening mammogram for malignant neoplasm of breast: Secondary | ICD-10-CM | POA: Insufficient documentation

## 2022-05-17 ENCOUNTER — Encounter: Payer: Self-pay | Admitting: Podiatry

## 2022-05-17 ENCOUNTER — Ambulatory Visit (INDEPENDENT_AMBULATORY_CARE_PROVIDER_SITE_OTHER): Payer: Medicare PPO

## 2022-05-17 ENCOUNTER — Ambulatory Visit: Payer: Medicare PPO | Admitting: Podiatry

## 2022-05-17 DIAGNOSIS — L84 Corns and callosities: Secondary | ICD-10-CM

## 2022-05-17 DIAGNOSIS — E119 Type 2 diabetes mellitus without complications: Secondary | ICD-10-CM | POA: Diagnosis not present

## 2022-05-17 DIAGNOSIS — M79674 Pain in right toe(s): Secondary | ICD-10-CM | POA: Diagnosis not present

## 2022-05-17 DIAGNOSIS — M79675 Pain in left toe(s): Secondary | ICD-10-CM

## 2022-05-17 DIAGNOSIS — M2041 Other hammer toe(s) (acquired), right foot: Secondary | ICD-10-CM

## 2022-05-17 DIAGNOSIS — B351 Tinea unguium: Secondary | ICD-10-CM | POA: Diagnosis not present

## 2022-05-17 DIAGNOSIS — M2042 Other hammer toe(s) (acquired), left foot: Secondary | ICD-10-CM | POA: Diagnosis not present

## 2022-05-17 DIAGNOSIS — M216X9 Other acquired deformities of unspecified foot: Secondary | ICD-10-CM

## 2022-05-17 NOTE — Progress Notes (Addendum)
This patient returns to my office for at risk foot care.  This patient requires this care by a professional since this patient will be at risk due to having  Diabetes.  She presents to the office for preventative foot care services.  General Appearance  Alert, conversant and in no acute stress.  Vascular  Dorsalis pedis and posterior tibial  pulses are palpable  bilaterally.  Capillary return is within normal limits  bilaterally. Temperature is within normal limits  bilaterally.  Neurologic  Senn-Weinstein monofilament wire test diminished   bilaterally. Muscle power within normal limits bilaterally.  Nails Thick disfigured hallux nails  B/L asymptomatic.   No evidence of bacterial infection or drainage bilaterally.  Pincer right hallux.  Orthopedic  No limitations of motion  feet .  No crepitus or effusions noted.  No bony pathology or digital deformities noted.  Plantar flexed fourth metatarsal  B/L. Hammer toes 2-4  B/L  Skin  normotropic skin with  Porokeratosis sub 4th  B/L and sub 3rd  B/L.   noted bilaterally.  No signs of infections or ulcers noted.     Onychomycosis  Pain in right toes  Pain in left toes  Consent was obtained for treatment procedures.   Mechanical debridement of nails 1-5  bilaterally performed with a nail nipper.  Callus was done as a Manufacturing engineer.    Return office visit     4 months                  No evidence of vascular or neurologic pathology. Callus done as a courtesy.  Patient request orthoses and diabetic shoes.  Christan casted the patient.   Gardiner Barefoot DPM

## 2022-05-17 NOTE — Progress Notes (Signed)
Patient presents to the office today for diabetic shoe and insole measuring.  Patient was measured with brannock device to determine size and width for 1 pair of extra depth shoes and foam casted for 3 pair of insoles.   ABN signed.   Documentation of medical necessity will be sent to patient's treating diabetic doctor to verify and sign.   Patient's diabetic provider: DR BABAOFF  Shoes and insoles will be ordered at that time and patient will be notified for an appointment for fitting when they arrive.   Patient shoe selection-   1st   Shoe choice:   Bracey size ordered: 9W

## 2022-05-17 NOTE — Addendum Note (Signed)
Addended by: Gardiner Barefoot on: 05/17/2022 09:13 AM   Modules accepted: Orders

## 2022-06-10 ENCOUNTER — Other Ambulatory Visit: Payer: Self-pay

## 2022-06-10 DIAGNOSIS — L304 Erythema intertrigo: Secondary | ICD-10-CM

## 2022-06-10 MED ORDER — CLOBETASOL PROPIONATE 0.05 % EX CREA: 1.0000 | TOPICAL_CREAM | Freq: Every day | CUTANEOUS | 0 refills | Status: AC

## 2022-06-10 MED ORDER — KETOCONAZOLE 2 % EX CREA: TOPICAL_CREAM | CUTANEOUS | 0 refills | Status: DC

## 2022-06-10 NOTE — Progress Notes (Signed)
Patient called requesting Rfs of Clobetasol as she has ran out at home. aw

## 2022-06-14 ENCOUNTER — Telehealth: Payer: Self-pay

## 2022-06-14 MED ORDER — BETAMETHASONE DIPROPIONATE 0.05 % EX CREA: TOPICAL_CREAM | Freq: Two times a day (BID) | CUTANEOUS | 2 refills | Status: DC | PRN

## 2022-06-14 NOTE — Telephone Encounter (Signed)
ERX'd Diprolene cream to [facility]  LM on VM

## 2022-06-14 NOTE — Telephone Encounter (Signed)
Dr Raliegh Ip pt   Patient called to let us know Clobetasol cream will cost her $90 she would like to know if there is anything she can get cheaper that is similar to  Clobetasol cream because Clobetasol cream help control her psoriasis

## 2022-06-16 ENCOUNTER — Ambulatory Visit (INDEPENDENT_AMBULATORY_CARE_PROVIDER_SITE_OTHER): Payer: Medicare PPO

## 2022-06-16 DIAGNOSIS — M2042 Other hammer toe(s) (acquired), left foot: Secondary | ICD-10-CM

## 2022-06-16 DIAGNOSIS — E119 Type 2 diabetes mellitus without complications: Secondary | ICD-10-CM | POA: Diagnosis not present

## 2022-06-16 DIAGNOSIS — M2041 Other hammer toe(s) (acquired), right foot: Secondary | ICD-10-CM

## 2022-06-16 NOTE — Progress Notes (Signed)
Patient presents today to pick up diabetic shoes and insoles.  Patient was dispensed 1 pair of diabetic shoes and 3 pairs of foam casted diabetic insoles.   She tried on the shoes with the insoles and the fit was satisfactory.   Will follow up next year for new order.    

## 2022-11-09 ENCOUNTER — Encounter: Payer: Self-pay | Admitting: Family Medicine

## 2022-11-11 ENCOUNTER — Ambulatory Visit: Payer: Medicare PPO | Admitting: Dermatology

## 2022-11-11 DIAGNOSIS — L820 Inflamed seborrheic keratosis: Secondary | ICD-10-CM | POA: Diagnosis not present

## 2022-11-11 DIAGNOSIS — L570 Actinic keratosis: Secondary | ICD-10-CM | POA: Diagnosis not present

## 2022-11-11 DIAGNOSIS — Z1283 Encounter for screening for malignant neoplasm of skin: Secondary | ICD-10-CM | POA: Diagnosis not present

## 2022-11-11 DIAGNOSIS — Z79899 Other long term (current) drug therapy: Secondary | ICD-10-CM

## 2022-11-11 DIAGNOSIS — D229 Melanocytic nevi, unspecified: Secondary | ICD-10-CM

## 2022-11-11 DIAGNOSIS — L821 Other seborrheic keratosis: Secondary | ICD-10-CM

## 2022-11-11 DIAGNOSIS — L578 Other skin changes due to chronic exposure to nonionizing radiation: Secondary | ICD-10-CM

## 2022-11-11 DIAGNOSIS — L814 Other melanin hyperpigmentation: Secondary | ICD-10-CM

## 2022-11-11 DIAGNOSIS — R21 Rash and other nonspecific skin eruption: Secondary | ICD-10-CM

## 2022-11-11 DIAGNOSIS — Z85828 Personal history of other malignant neoplasm of skin: Secondary | ICD-10-CM

## 2022-11-11 DIAGNOSIS — Z85820 Personal history of malignant melanoma of skin: Secondary | ICD-10-CM

## 2022-11-11 DIAGNOSIS — D1801 Hemangioma of skin and subcutaneous tissue: Secondary | ICD-10-CM

## 2022-11-11 DIAGNOSIS — L304 Erythema intertrigo: Secondary | ICD-10-CM

## 2022-11-11 DIAGNOSIS — Z86006 Personal history of melanoma in-situ: Secondary | ICD-10-CM

## 2022-11-11 DIAGNOSIS — Z7189 Other specified counseling: Secondary | ICD-10-CM

## 2022-11-11 DIAGNOSIS — Z86018 Personal history of other benign neoplasm: Secondary | ICD-10-CM

## 2022-11-11 DIAGNOSIS — W908XXA Exposure to other nonionizing radiation, initial encounter: Secondary | ICD-10-CM

## 2022-11-11 MED ORDER — MOMETASONE FUROATE 0.1 % EX CREA: TOPICAL_CREAM | CUTANEOUS | 3 refills | Status: AC

## 2022-11-11 MED ORDER — KETOCONAZOLE 2 % EX CREA: TOPICAL_CREAM | CUTANEOUS | 6 refills | Status: DC

## 2022-11-11 NOTE — Patient Instructions (Signed)

## 2022-11-11 NOTE — Progress Notes (Signed)
Follow-Up Visit   Subjective  Valerie Farmer is a 72 y.o. female who presents for the following: Skin Cancer Screening and Full Body Skin Exam, hx of Melanoma in situ, hx of BCC, hx of Dysplastic nevus  The patient presents for Total-Body Skin Exam (TBSE) for skin cancer screening and mole check. The patient has spots, moles and lesions to be evaluated, some may be new or changing and the patient may have concern these could be cancer.  The following portions of the chart were reviewed this encounter and updated as appropriate: medications, allergies, medical history  Review of Systems:  No other skin or systemic complaints except as noted in HPI or Assessment and Plan.  Objective  Well appearing patient in no apparent distress; mood and affect are within normal limits.  A full examination was performed including scalp, head, eyes, ears, nose, lips, neck, chest, axillae, abdomen, back, buttocks, bilateral upper extremities, bilateral lower extremities, hands, feet, fingers, toes, fingernails, and toenails. All findings within normal limits unless otherwise noted below.   Relevant physical exam findings are noted in the Assessment and Plan.  left posterior shoulder Stuck-on, waxy, tan-brown papule-- Discussed benign etiology and prognosis.   right temple Erythematous thin papules/macules with gritty scale.    Assessment & Plan   SKIN CANCER SCREENING PERFORMED TODAY.  ACTINIC DAMAGE - Chronic condition, secondary to cumulative UV/sun exposure - diffuse scaly erythematous macules with underlying dyspigmentation - Recommend daily broad spectrum sunscreen SPF 30+ to sun-exposed areas, reapply every 2 hours as needed.  - Staying in the shade or wearing long sleeves, sun glasses (UVA+UVB protection) and wide brim hats (4-inch brim around the entire circumference of the hat) are also recommended for sun protection.  - Call for new or changing lesions.  LENTIGINES, SEBORRHEIC  KERATOSES, HEMANGIOMAS - Benign normal skin lesions - Benign-appearing - Call for any changes  MELANOCYTIC NEVI - Tan-brown and/or pink-flesh-colored symmetric macules and papules - Benign appearing on exam today - Observation - Call clinic for new or changing moles - Recommend daily use of broad spectrum spf 30+ sunscreen to sun-exposed areas.   Rash Exam: Pink patch at the left forehead  Differential diagnosis:  Contact dermatitis vs Rash  Treatment Plan: Start Mometasone cream apply to affected skin qd-bid prn    Intertrigo left inframammary, right inframammary Chronic and persistent condition with duration or expected duration over one year. Condition is improving with treatment but not currently at goal. Intertrigo is a chronic recurrent rash that occurs in skin fold areas that may be associated with friction; heat; moisture; yeast; fungus; and bacteria.  It is exacerbated by increased movement / activity; sweating; and higher atmospheric temperature.    Cont Ketoconazole 2% cream daily   HISTORY OF DYSPLASTIC NEVUS Multiple see history  No evidence of recurrence today Recommend regular full body skin exams Recommend daily broad spectrum sunscreen SPF 30+ to sun-exposed areas, reapply every 2 hours as needed.  Call if any new or changing lesions are noted between office visits   HISTORY OF BASAL CELL CARCINOMA OF THE SKIN Right mid forearm 2014  - No evidence of recurrence today - Recommend regular full body skin exams - Recommend daily broad spectrum sunscreen SPF 30+ to sun-exposed areas, reapply every 2 hours as needed.  - Call if any new or changing lesions are noted between office visits   HISTORY OF MELANOMA IN SITU L inf scapula Excised 04/2014 - No evidence of recurrence today - Recommend regular full  body skin exams - Recommend daily broad spectrum sunscreen SPF 30+ to sun-exposed areas, reapply every 2 hours as needed.  - Call if any new or changing  lesions are noted between office visits   Inflamed seborrheic keratosis left posterior shoulder  Symptomatic, irritating, patient would like treated.   Destruction of lesion - left posterior shoulder Complexity: simple   Destruction method: cryotherapy   Informed consent: discussed and consent obtained   Timeout:  patient name, date of birth, surgical site, and procedure verified Lesion destroyed using liquid nitrogen: Yes   Region frozen until ice ball extended beyond lesion: Yes   Outcome: patient tolerated procedure well with no complications   Post-procedure details: wound care instructions given    AK (actinic keratosis) right temple  Actinic keratoses are precancerous spots that appear secondary to cumulative UV radiation exposure/sun exposure over time. They are chronic with expected duration over 1 year. A portion of actinic keratoses will progress to squamous cell carcinoma of the skin. It is not possible to reliably predict which spots will progress to skin cancer and so treatment is recommended to prevent development of skin cancer.  Recommend daily broad spectrum sunscreen SPF 30+ to sun-exposed areas, reapply every 2 hours as needed.  Recommend staying in the shade or wearing long sleeves, sun glasses (UVA+UVB protection) and wide brim hats (4-inch brim around the entire circumference of the hat). Call for new or changing lesions.   Destruction of lesion - right temple Complexity: simple   Destruction method: cryotherapy   Informed consent: discussed and consent obtained   Timeout:  patient name, date of birth, surgical site, and procedure verified Lesion destroyed using liquid nitrogen: Yes   Region frozen until ice ball extended beyond lesion: Yes   Outcome: patient tolerated procedure well with no complications   Post-procedure details: wound care instructions given    Intertrigo  Related Medications ketoconazole (NIZORAL) 2 % cream APPLY TO SKIN 1 TO 2 TIMES  DAILY   Return in about 1 year (around 11/11/2023) for TBSE, hx of MMis, hx BCC, hx of Dysplastic nevus .  IAngelique Holm, CMA, am acting as scribe for Armida Sans, MD .   Documentation: I have reviewed the above documentation for accuracy and completeness, and I agree with the above.  Armida Sans, MD

## 2022-11-15 ENCOUNTER — Ambulatory Visit: Payer: Medicare PPO | Admitting: Podiatry

## 2022-11-15 ENCOUNTER — Encounter: Payer: Self-pay | Admitting: Podiatry

## 2022-11-15 DIAGNOSIS — E119 Type 2 diabetes mellitus without complications: Secondary | ICD-10-CM

## 2022-11-15 DIAGNOSIS — M79674 Pain in right toe(s): Secondary | ICD-10-CM

## 2022-11-15 DIAGNOSIS — B351 Tinea unguium: Secondary | ICD-10-CM

## 2022-11-15 DIAGNOSIS — Q828 Other specified congenital malformations of skin: Secondary | ICD-10-CM

## 2022-11-15 DIAGNOSIS — M79675 Pain in left toe(s): Secondary | ICD-10-CM | POA: Diagnosis not present

## 2022-11-15 NOTE — Progress Notes (Signed)
This patient returns to my office for at risk foot care.  This patient requires this care by a professional since this patient will be at risk due to having  Diabetes.  She presents to the office for preventative foot care services.  General Appearance  Alert, conversant and in no acute stress.  Vascular  Dorsalis pedis and posterior tibial  pulses are palpable  bilaterally.  Capillary return is within normal limits  bilaterally. Temperature is within normal limits  bilaterally.  Neurologic  Senn-Weinstein monofilament wire test diminished   bilaterally. Muscle power within normal limits bilaterally.  Nails Thick disfigured hallux nails  B/L asymptomatic.   No evidence of bacterial infection or drainage bilaterally.  Pincer right hallux.  Orthopedic  No limitations of motion  feet .  No crepitus or effusions noted.  No bony pathology or digital deformities noted.  Plantar flexed fourth metatarsal  B/L. Hammer toes 2-4  B/L  Skin  normotropic skin with  Porokeratosis sub 4th  B/L and sub 3rd  B/L.   noted bilaterally.  No signs of infections or ulcers noted.     Onychomycosis  Pain in right toes  Pain in left toes  Consent was obtained for treatment procedures.   Mechanical debridement of nails 1-5  bilaterally performed with a nail nipper.  Callus was done as a Research officer, political party.    Return office visit     4 months                  No evidence of vascular or neurologic pathology. Callus done as a courtesy.     Helane Gunther DPM

## 2022-11-21 ENCOUNTER — Encounter: Payer: Self-pay | Admitting: Dermatology

## 2022-12-02 ENCOUNTER — Encounter: Payer: Self-pay | Admitting: Obstetrics & Gynecology

## 2022-12-02 ENCOUNTER — Ambulatory Visit (INDEPENDENT_AMBULATORY_CARE_PROVIDER_SITE_OTHER): Payer: Medicare PPO | Admitting: Obstetrics & Gynecology

## 2022-12-02 VITALS — BP 130/71 | Ht 61.0 in | Wt 173.0 lb

## 2022-12-02 DIAGNOSIS — Z1239 Encounter for other screening for malignant neoplasm of breast: Secondary | ICD-10-CM

## 2022-12-02 DIAGNOSIS — Z01419 Encounter for gynecological examination (general) (routine) without abnormal findings: Secondary | ICD-10-CM | POA: Diagnosis not present

## 2022-12-02 DIAGNOSIS — Z1231 Encounter for screening mammogram for malignant neoplasm of breast: Secondary | ICD-10-CM

## 2022-12-02 NOTE — Progress Notes (Signed)
ANNUAL PREVENTATIVE CARE GYNECOLOGY  ENCOUNTER NOTE  Subjective:       Valerie Farmer is a 72 y.o. married [id number] (She has 2 adopted kids and 4 grandkids) here for a routine annual gynecologic exam. The patient is sexually active. The patient is not taking hormone replacement therapy. Patient denies post-menopausal vaginal bleeding. The patient wears seatbelts: yes. The patient participates in regular exercise: no. Has the patient ever been transfused or tattooed?: no. The patient reports that there is not domestic violence in her life.  Current complaints: 1.  She would like a recommendation for a vaginal lubricant that may be better than KY.  She also reports that she has to get up twice per night to void, for many years, drinks Diet Coke right up until the time that she goes to bed. She has been having some night time wetting accidents for several months, started to wear pads to bed at night.   Gynecologic History No LMP recorded. Patient has had a hysterectomy.  Last mammogram: 2023. Results were: normal Last Colonoscopy: 2017 (good for 10 years) Last Dexa Scan: done 2 years ago   Obstetric History OB History  Gravida Para Term Preterm AB Living  0 0 0 0 0 0  SAB IAB Ectopic Multiple Live Births  0 0 0 0 0  Obstetric Comments  1st Menstrual Cycle:  10    Past Medical History:  Diagnosis Date   Actinic keratosis    Anxiety    Basal cell carcinoma 10/16/2012   Right mid forearm. Superficial.    Diabetes (HCC)    type 2   Dysplastic nevus 09/06/2007   Left mid upper back paraspinal. Moderate atypia, margins involved.   Dysplastic nevus 10/04/2007   Left lat. calf. Slight to moderate atypia, close to margin.   Dysplastic nevus 10/04/2007   Right dorsum of foot. Moderate to marked atypia, close to margin. Excised 11/29/2007, margins free.   Dysplastic nevus 05/28/2008   RUQ abdomen. Slight atypia, limited margins free.   Dysplastic nevus 05/28/2008   Right epigastric.  Slight to moderate atypia, close to margin.   HBP (high blood pressure)    Hyperlipidemia    Hypertension    Melanoma in situ (HCC) 05/20/2014   Left inferior scapula. MIS arising in a dysplastic nevus, lateral margin involved. Excised 05/28/2014, margins free.   Pancreatitis 2005   Psoriasis    Skin cancer    Swelling     Family History  Problem Relation Age of Onset   Bladder Cancer Father    Breast cancer Paternal Aunt    Leukemia Maternal Aunt    Bladder Cancer Maternal Uncle     Past Surgical History:  Procedure Laterality Date   BREAST BIOPSY Right    BREAST BIOPSY Right 05/25/2019   Affirm bx-"X" clip-path pending   BREAST CYST ASPIRATION Left 2015   BREAST SURGERY Left 09/05/2013   FNA: Comments: LEFT BREAST   BREAST SURGERY Right 1990   CATARACT EXTRACTION W/PHACO Left 09/13/2018   Procedure: CATARACT EXTRACTION PHACO AND INTRAOCULAR LENS PLACEMENT (IOC) LEFT DIABETES  panoptic lens;  Surgeon: Lockie Mola, MD;  Location: [facility];  Service: Ophthalmology;  Laterality: Left;  Diabetic - oral meds   CATARACT EXTRACTION W/PHACO Right 10/11/2018   Procedure: CATARACT EXTRACTION PHACO AND INTRAOCULAR LENS PLACEMENT (IOC)  RIGHT DIABETIC PANOPTIX LENS;  Surgeon: Lockie Mola, MD;  Location: [facility];  Service: Ophthalmology;  Laterality: Right;  DIABETIC   CHOLECYSTECTOMY  2005  COLONOSCOPY  2007   DR. Byrnett, normal study   COLONOSCOPY WITH PROPOFOL N/A 11/10/2015   Procedure: COLONOSCOPY WITH PROPOFOL;  Surgeon: Earline Mayotte, MD;  Location: [facility];  Service: Endoscopy;  Laterality: N/A;   VAGINAL HYSTERECTOMY  2005    Social History   Socioeconomic History   Marital status: Married    Spouse name: Not on file   Number of children: Not on file   Years of education: Not on file   Highest education level: Not on file  Occupational History   Not on file  Tobacco Use   Smoking status: Never   Smokeless tobacco:  Never  Vaping Use   Vaping status: Never Used  Substance and Sexual Activity   Alcohol use: No   Drug use: No   Sexual activity: Not Currently    Birth control/protection: Post-menopausal  Other Topics Concern   Not on file  Social History Narrative   Not on file   Social Determinants of Health   Financial Resource Strain: Low Risk  (05/10/2022)   Received from [facility], [facility]   Overall Financial Resource Strain (CARDIA)    Difficulty of Paying Living Expenses: Not hard at all  Food Insecurity: No Food Insecurity (05/10/2022)   Received from [facility], [facility]   Hunger Vital Sign    Worried About Running Out of Food in the Last Year: Never true    Ran Out of Food in the Last Year: Never true  Transportation Needs: No Transportation Needs (05/10/2022)   Received from [facility], [facility]   [facility] - Transportation    In the past 12 months, has lack of transportation kept you from medical appointments or from getting medications?: No    Lack of Transportation (Non-Medical): No  Physical Activity: Not on file  Stress: Not on file  Social Connections: Not on file  Intimate Partner Violence: Not on file    Current Outpatient Medications on File Prior to Visit  Medication Sig Dispense Refill   ALPRAZolam (XANAX) 0.25 MG tablet Take by mouth at bedtime as needed. Reported on 10/01/2015     aspirin EC 81 MG tablet Take 81 mg by mouth daily.      atorvastatin (LIPITOR) 20 MG tablet Take 20 mg by mouth daily.      betamethasone dipropionate 0.05 % cream Apply topically 2 (two) times daily as needed (Rash). 45 g 2   Calcium Carb-Cholecalciferol (OYSTER SHELL CALCIUM W/D) 500-5 MG-MCG TABS Take 1 tablet by mouth daily.     calcium-vitamin D (OSCAL WITH D) 500-200 MG-UNIT TABS tablet Take by mouth.     clobetasol cream (TEMOVATE) 0.05 % Apply 1 Application topically  daily. Up to 5 days per week as needed for flares, avoid face, groin and underarms 30 g 0   escitalopram (LEXAPRO) 10 MG tablet Take 10 mg by mouth. Take 1 1/2 tabs daily     glipiZIDE (GLUCOTROL XL) 10 MG 24 hr tablet Take 1 tablet by mouth daily.     Influenza vac split quadrivalent PF (FLUZONE HIGH-DOSE) 0.5 ML injection      ketoconazole (NIZORAL) 2 % cream APPLY TO SKIN 1 TO 2 TIMES DAILY 60 g 6   losartan-hydrochlorothiazide (HYZAAR) 50-12.5 MG tablet      metFORMIN (GLUCOPHAGE-XR) 500 MG 24 hr tablet   0   mometasone (ELOCON) 0.1 % cream Apply to affected skin qd-bid prn 15  g 3   mometasone (NASONEX) 50 MCG/ACT nasal spray Place 2 sprays into the nose daily as needed.      ONE TOUCH ULTRA TEST test strip      Probiotic Product (ALIGN PO) Take 1 tablet by mouth daily.      Cysteamine Bitartrate (PROCYSBI) 300 MG PACK by XX route as directed (Patient not taking: Reported on 11/17/2021)     hydrochlorothiazide (HYDRODIURIL) 12.5 MG tablet Take 12.5 mg by mouth daily.     losartan (COZAAR) 50 MG tablet Take 50 mg by mouth daily.     sitaGLIPtin (JANUVIA) 100 MG tablet TAKE 1 TABLET(100 MG) BY MOUTH EVERY DAY     sitaGLIPtin (JANUVIA) 25 MG tablet Take 25 mg by mouth daily.     No current facility-administered medications on file prior to visit.    No Known Allergies    Review of Systems ROS Review of Systems - General ROS: negative for - chills, fatigue, fever, hot flashes, night sweats, weight gain or weight loss Psychological ROS: negative for - anxiety, decreased libido, depression, mood swings, physical abuse or sexual abuse Ophthalmic ROS: negative for - blurry vision, eye pain or loss of vision ENT ROS: negative for - headaches, hearing change, visual changes or vocal changes Allergy and Immunology ROS: negative for - hives, itchy/watery eyes or seasonal allergies Hematological and Lymphatic ROS: negative for - bleeding problems, bruising, swollen lymph nodes or weight  loss Endocrine ROS: negative for - galactorrhea, hair pattern changes, hot flashes, malaise/lethargy, mood swings, palpitations, polydipsia/polyuria, skin changes, temperature intolerance or unexpected weight changes Breast ROS: negative for - new or changing breast lumps or nipple discharge Respiratory ROS: negative for - cough or shortness of breath Cardiovascular ROS: negative for - chest pain, irregular heartbeat, palpitations or shortness of breath Gastrointestinal ROS: no abdominal pain, change in bowel habits, or black or bloody stools Genito-Urinary ROS: no dysuria, trouble voiding, or hematuria Musculoskeletal ROS: negative for - joint pain or joint stiffness Neurological ROS: negative for - bowel and bladder control changes Dermatological ROS: negative for rash and skin lesion changes   Objective:   BP 130/71   Ht 5\' 1"  (1.549 m)   Wt 173 lb (78.5 kg)   BMI 32.69 kg/m  Well nourished, well hydrated White female, no apparent distress She is ambulating and conversing normally. General:  alert   Breasts:  inspection negative, no nipple discharge or bleeding, no masses or nodularity palpable  Lungs: clear to auscultation bilaterally  Heart:  regular rate and rhythm, S1, S2 normal, no murmur, click, rub or gallop  Abdomen: soft, non-tender; bowel sounds normal; no masses,  no organomegaly, intertrigo beneath her panus   Vulva:  Normal, expected atrophy noted  Vagina: Normal, expected atrophy noted        Adnexa:  not enlarged or painful  Rectal Exam: Not performed.    Labs: Lab Results  Component Value Date   WBC 10.1 09/02/2018   HGB 12.4 09/02/2018   HCT 36.8 09/02/2018   MCV 91.1 09/02/2018   PLT 281 09/02/2018    Lab Results  Component Value Date   CREATININE 1.24 (H) 09/02/2018   BUN 29 (H) 09/02/2018   NA 133 (L) 09/02/2018   K 3.7 09/02/2018   CL 96 (L) 09/02/2018   CO2 25 09/02/2018    Lab Results  Component Value Date   ALT 10 09/02/2018   AST 21  09/02/2018   ALKPHOS 83 09/02/2018   BILITOT 1.4 (H) 09/02/2018  Assessment:   1. Well woman exam with routine gynecological exam   2. Encounter for screening mammogram for malignant neoplasm of breast   3. Encounter for breast cancer screening using non-mammogram modality    4.     Nocturia  Plan:  I rec'd SVE monthly I ordered a screening mammogram I rec'd Astroglide. She declined vaginal estrogen treatment. I strongly rec'd that she do a trial of not drinking anything at all after 8 pm to see if she still has nocturia. She declined urology referral at this time. Return to Clinic - 1 Year/prn sooner   Allie Bossier, MD [facility]

## 2022-12-06 ENCOUNTER — Other Ambulatory Visit: Payer: Self-pay | Admitting: Obstetrics & Gynecology

## 2022-12-06 DIAGNOSIS — Z1231 Encounter for screening mammogram for malignant neoplasm of breast: Secondary | ICD-10-CM

## 2022-12-23 ENCOUNTER — Ambulatory Visit: Payer: Medicare PPO | Admitting: Dermatology

## 2022-12-24 ENCOUNTER — Ambulatory Visit
Admission: RE | Admit: 2022-12-24 | Discharge: 2022-12-24 | Disposition: A | Discharge status: Home / Self Care | Payer: Medicare PPO | Source: Ambulatory Visit | Attending: Obstetrics & Gynecology | Admitting: Obstetrics & Gynecology

## 2022-12-24 DIAGNOSIS — Z1231 Encounter for screening mammogram for malignant neoplasm of breast: Secondary | ICD-10-CM | POA: Insufficient documentation

## 2023-05-19 ENCOUNTER — Encounter: Payer: Self-pay | Admitting: Podiatry

## 2023-05-19 ENCOUNTER — Ambulatory Visit: Payer: Medicare PPO | Admitting: Podiatry

## 2023-05-19 DIAGNOSIS — M79674 Pain in right toe(s): Secondary | ICD-10-CM

## 2023-05-19 DIAGNOSIS — M2042 Other hammer toe(s) (acquired), left foot: Secondary | ICD-10-CM

## 2023-05-19 DIAGNOSIS — B351 Tinea unguium: Secondary | ICD-10-CM

## 2023-05-19 DIAGNOSIS — Q828 Other specified congenital malformations of skin: Secondary | ICD-10-CM

## 2023-05-19 DIAGNOSIS — M79675 Pain in left toe(s): Secondary | ICD-10-CM | POA: Diagnosis not present

## 2023-05-19 DIAGNOSIS — E119 Type 2 diabetes mellitus without complications: Secondary | ICD-10-CM

## 2023-05-19 DIAGNOSIS — M2010 Hallux valgus (acquired), unspecified foot: Secondary | ICD-10-CM

## 2023-05-19 DIAGNOSIS — M216X9 Other acquired deformities of unspecified foot: Secondary | ICD-10-CM | POA: Diagnosis not present

## 2023-05-19 DIAGNOSIS — L84 Corns and callosities: Secondary | ICD-10-CM

## 2023-05-19 NOTE — Progress Notes (Addendum)
 This patient returns to my office for at risk foot care.  This patient requires this care by a professional since this patient will be at risk due to having  Diabetes.  She presents to the office for preventative foot care services.  General Appearance  Alert, conversant and in no acute stress.  Vascular  Dorsalis pedis and posterior tibial  pulses are palpable  bilaterally.  Capillary return is within normal limits  bilaterally. Temperature is within normal limits  bilaterally.  Neurologic  Senn-Weinstein monofilament wire test diminished   bilaterally. Muscle power within normal limits bilaterally.  Nails Thick disfigured hallux nails  B/L asymptomatic.   No evidence of bacterial infection or drainage bilaterally.  Pincer right hallux.  Orthopedic  No limitations of motion  feet .  No crepitus or effusions noted.  No bony pathology or digital deformities noted.  Plantar flexed fourth metatarsal  B/L. Hammer toes 2-4  B/L.  Tailor bunion 5th MPJ  right.  Patient qualifies for diabetic shoes due to DPN and tailors bunon   Skin  normotropic skin with  Porokeratosis sub 4th  B/L and sub 3rd  B/L.   noted bilaterally.  No signs of infections or ulcers noted.     Onychomycosis  Pain in right toes  Pain in left toes  Callus forefoot  B/L.  Consent was obtained for treatment procedures.   Mechanical debridement of nails 1-5  bilaterally performed with a nail nipper.  Callus debrided using # 15 blade and dremel tool.  To talk with Rosann Auerbach about diabetic insoles and shoes.  Helane Gunther DPM    Return office visit     6  months                   Helane Gunther [facility]

## 2023-05-19 NOTE — Progress Notes (Signed)
 Patient presents to the office today for diabetic shoe and insole measuring.  Patient was measured with brannock device to determine size and width for 1 pair of extra depth shoes and foam casted for 3 pair of insoles.   Documentation of medical necessity will be sent to patient's treating diabetic doctor to verify and sign.   Patient's diabetic provider: Kandyce Rud MD   Shoes and insoles will be ordered at that time and patient will be notified for an appointment for fitting when they arrive.   Shoe size (per patient): 9 Brannock measurement: 9 Shoe choice:   825 / [id number] Shoe size ordered: 9WD

## 2023-05-19 NOTE — Addendum Note (Signed)
 Addended by: Helane Gunther on: 05/19/2023 01:24 PM   Modules accepted: Orders

## 2023-07-12 ENCOUNTER — Telehealth: Payer: Self-pay

## 2023-07-12 NOTE — Telephone Encounter (Signed)
 Shoes and inserts will be shipped in couple weeks appt set for 5/27 tentative patient is on vacation until 5/18 will fit patient in if in sooner.  Britton Cane Cped, CFo, CFm

## 2023-08-02 ENCOUNTER — Encounter: Payer: Self-pay | Admitting: Dermatology

## 2023-08-02 ENCOUNTER — Ambulatory Visit: Admitting: Dermatology

## 2023-08-02 DIAGNOSIS — L209 Atopic dermatitis, unspecified: Secondary | ICD-10-CM

## 2023-08-02 DIAGNOSIS — L821 Other seborrheic keratosis: Secondary | ICD-10-CM | POA: Diagnosis not present

## 2023-08-02 NOTE — Progress Notes (Signed)
   Follow-Up Visit   Subjective  Valerie Farmer is a 73 y.o. female who presents for the following: spot at right eyelid, present for a few weeks but seems to be getting smaller. Patient had similar spot come up in the same area in January and eventually went away.   The patient has spots, moles and lesions to be evaluated, some may be new or changing and the patient may have concern these could be cancer.   The following portions of the chart were reviewed this encounter and updated as appropriate: medications, allergies, medical history  Review of Systems:  No other skin or systemic complaints except as noted in HPI or Assessment and Plan.  Objective  Well appearing patient in no apparent distress; mood and affect are within normal limits.   A focused examination was performed of the following areas: Face, left arm  Relevant exam findings are noted in the Assessment and Plan.    Assessment & Plan   SEBORRHEIC KERATOSIS (vs verruca) - Stuck-on, waxy, tan-brown papules and/or plaques R superior eyelid  - Benign-appearing - Discussed benign etiology and prognosis. Offered cryo vs biopsy. Patient declines - Observe - Call for any changes    ECZEMA vs BITE RXN Exam: left medial forearm erythematous excoriated papules  Treatment Plan: Patient defers topical rx.   SEBORRHEIC KERATOSES   ATOPIC DERMATITIS, UNSPECIFIED TYPE    Return for TBSE, as scheduled, with Dr. Linnell Richardson, HxMMis, HxBCC, HxDN.  Kerstin Peeling, RMA, am acting as scribe for Harris Liming, MD .   Documentation: I have reviewed the above documentation for accuracy and completeness, and I agree with the above.  Harris Liming, MD

## 2023-08-02 NOTE — Patient Instructions (Signed)

## 2023-08-23 ENCOUNTER — Ambulatory Visit (INDEPENDENT_AMBULATORY_CARE_PROVIDER_SITE_OTHER)

## 2023-08-23 DIAGNOSIS — M2042 Other hammer toe(s) (acquired), left foot: Secondary | ICD-10-CM | POA: Diagnosis not present

## 2023-08-23 DIAGNOSIS — M2141 Flat foot [pes planus] (acquired), right foot: Secondary | ICD-10-CM | POA: Diagnosis not present

## 2023-08-23 DIAGNOSIS — M2142 Flat foot [pes planus] (acquired), left foot: Secondary | ICD-10-CM

## 2023-08-23 DIAGNOSIS — E119 Type 2 diabetes mellitus without complications: Secondary | ICD-10-CM | POA: Diagnosis not present

## 2023-08-23 DIAGNOSIS — L84 Corns and callosities: Secondary | ICD-10-CM

## 2023-09-26 NOTE — Progress Notes (Signed)

## 2023-11-03 ENCOUNTER — Other Ambulatory Visit: Payer: Self-pay | Admitting: Obstetrics & Gynecology

## 2023-11-03 DIAGNOSIS — Z1231 Encounter for screening mammogram for malignant neoplasm of breast: Secondary | ICD-10-CM

## 2023-11-16 ENCOUNTER — Ambulatory Visit: Payer: Medicare PPO | Admitting: Podiatry

## 2023-11-16 ENCOUNTER — Encounter: Payer: Self-pay | Admitting: Podiatry

## 2023-11-16 DIAGNOSIS — M79674 Pain in right toe(s): Secondary | ICD-10-CM

## 2023-11-16 DIAGNOSIS — B351 Tinea unguium: Secondary | ICD-10-CM

## 2023-11-16 DIAGNOSIS — M79675 Pain in left toe(s): Secondary | ICD-10-CM | POA: Diagnosis not present

## 2023-11-16 DIAGNOSIS — E119 Type 2 diabetes mellitus without complications: Secondary | ICD-10-CM

## 2023-11-16 NOTE — Progress Notes (Addendum)
 This patient returns to my office for at risk foot care.  This patient requires this care by a professional since this patient will be at risk due to having  Diabetes.  She presents to the office for preventative foot care services.  General Appearance  Alert, conversant and in no acute stress.  Vascular  Dorsalis pedis and posterior tibial  pulses are palpable  bilaterally.  Capillary return is within normal limits  bilaterally. Temperature is within normal limits  bilaterally.  Neurologic  Senn-Weinstein monofilament wire test diminished   bilaterally. Muscle power within normal limits bilaterally.  Nails Thick disfigured hallux nails  B/L asymptomatic.   No evidence of bacterial infection or drainage bilaterally.  Pincer right hallux.  Orthopedic  No limitations of motion  feet .  No crepitus or effusions noted.  No bony pathology or digital deformities noted.  Plantar flexed fourth metatarsal  B/L. Hammer toes 2-4  B/L.  Tailor bunion 5th MPJ  right.  Patient qualifies for diabetic shoes due to DPN and tailors bunon   Skin  normotropic skin with  Porokeratosis sub 4th  B/L and sub 3rd  B/L.   noted bilaterally.  No signs of infections or ulcers noted.     Onychomycosis  Pain in right toes  Pain in left toes  Callus forefoot  B/L.  Consent was obtained for treatment procedures.   Mechanical debridement of nails 1-5  bilaterally performed with a nail nipper.  She says she has no recollection of receiving diabetic shoes through this office.  She points to her achilles tendon as hurting.  Told her to see Dr.  Verta.  Cordella Bold DPM    Return office visit     6  months                   Cordella Bold [facility]

## 2023-12-01 ENCOUNTER — Ambulatory Visit: Payer: Medicare PPO | Admitting: Dermatology

## 2023-12-05 ENCOUNTER — Ambulatory Visit (INDEPENDENT_AMBULATORY_CARE_PROVIDER_SITE_OTHER): Admitting: Obstetrics & Gynecology

## 2023-12-05 VITALS — BP 127/78 | HR 62 | Ht 60.0 in | Wt 173.1 lb

## 2023-12-05 DIAGNOSIS — N952 Postmenopausal atrophic vaginitis: Secondary | ICD-10-CM

## 2023-12-05 DIAGNOSIS — Z01419 Encounter for gynecological examination (general) (routine) without abnormal findings: Secondary | ICD-10-CM | POA: Diagnosis not present

## 2023-12-05 MED ORDER — ESTRADIOL 0.1 MG/GM VA CREA: TOPICAL_CREAM | VAGINAL | 5 refills | Status: DC

## 2023-12-05 NOTE — Progress Notes (Signed)
 ANNUAL PREVENTATIVE CARE GYNECOLOGY  ENCOUNTER NOTE  Subjective:       Valerie Farmer is a 73 y.o. married (for 53 years) here for a routine annual gynecologic exam. The patient is sexually active. At her last visit, we talked about vaginal lubricant. I rec'd Astroglide and this is working well. But she would be interested in trying vaginal estrogen. The patient is not taking hormone replacement therapy. Patient denies post-menopausal vaginal bleeding. The patient wears seatbelts: yes. The patient participates in regular exercise: yes. (Started walking)  Has the patient ever been transfused or tattooed?: no. The patient reports that there is not domestic violence in her life.  Current complaints: 1.  She would like to discuss her bone density test results, ordered by [facility].  They are not available on EPIC.   Gynecologic History No LMP recorded. Patient has had a hysterectomy.  Last mammogram: last year. Scheduled for next month Last Colonoscopy: up to date, due in 2027  Last Dexa Scan: 2025   Obstetric History OB History  Gravida Para Term Preterm AB Living  0 0 0 0 0 0  SAB IAB Ectopic Multiple Live Births  0 0 0 0 0  Obstetric Comments  1st Menstrual Cycle:  10    Past Medical History:  Diagnosis Date   Actinic keratosis    Anxiety    Basal cell carcinoma 10/16/2012   Right mid forearm. Superficial.    Diabetes (HCC)    type 2   Dysplastic nevus 09/06/2007   Left mid upper back paraspinal. Moderate atypia, margins involved.   Dysplastic nevus 10/04/2007   Left lat. calf. Slight to moderate atypia, close to margin.   Dysplastic nevus 10/04/2007   Right dorsum of foot. Moderate to marked atypia, close to margin. Excised 11/29/2007, margins free.   Dysplastic nevus 05/28/2008   RUQ abdomen. Slight atypia, limited margins free.   Dysplastic nevus 05/28/2008   Right epigastric. Slight to moderate atypia, close to margin.   HBP (high blood pressure)    Hyperlipidemia     Hypertension    Melanoma in situ (HCC) 05/20/2014   Left inferior scapula. MIS arising in a dysplastic nevus, lateral margin involved. Excised 05/28/2014, margins free.   Pancreatitis 2005   Psoriasis    Skin cancer    Swelling     Family History  Problem Relation Age of Onset   Bladder Cancer Father    Breast cancer Paternal Aunt    Leukemia Maternal Aunt    Bladder Cancer Maternal Uncle     Past Surgical History:  Procedure Laterality Date   BREAST BIOPSY Right    BREAST BIOPSY Right 05/25/2019   Affirm bx-X clip-path pending   BREAST CYST ASPIRATION Left 2015   BREAST SURGERY Left 09/05/2013   FNA: Comments: LEFT BREAST   BREAST SURGERY Right 1990   CATARACT EXTRACTION W/PHACO Left 09/13/2018   Procedure: CATARACT EXTRACTION PHACO AND INTRAOCULAR LENS PLACEMENT (IOC) LEFT DIABETES  panoptic lens;  Surgeon: Mittie Gaskin, MD;  Location: [facility];  Service: Ophthalmology;  Laterality: Left;  Diabetic - oral meds   CATARACT EXTRACTION W/PHACO Right 10/11/2018   Procedure: CATARACT EXTRACTION PHACO AND INTRAOCULAR LENS PLACEMENT (IOC)  RIGHT DIABETIC PANOPTIX LENS;  Surgeon: Mittie Gaskin, MD;  Location: [facility];  Service: Ophthalmology;  Laterality: Right;  DIABETIC   CHOLECYSTECTOMY  2005   COLONOSCOPY  2007   DR. Byrnett, normal study   COLONOSCOPY WITH PROPOFOL  N/A 11/10/2015   Procedure: COLONOSCOPY  WITH PROPOFOL ;  Surgeon: Reyes LELON Cota, MD;  Location: [facility];  Service: Endoscopy;  Laterality: N/A;   VAGINAL HYSTERECTOMY  2005    Social History   Socioeconomic History   Marital status: Married    Spouse name: Not on file   Number of children: Not on file   Years of education: Not on file   Highest education level: Not on file  Occupational History   Not on file  Tobacco Use   Smoking status: Never   Smokeless tobacco: Never  Vaping Use   Vaping status: Never Used  Substance and Sexual Activity   Alcohol use:  No   Drug use: No   Sexual activity: Not Currently    Birth control/protection: Post-menopausal  Other Topics Concern   Not on file  Social History Narrative   Not on file   Social Drivers of Health   Financial Resource Strain: Patient Declined (05/17/2023)   Received [facility]   Overall Financial Resource Strain (CARDIA)    Difficulty of Paying Living Expenses: Patient declined  Food Insecurity: Patient Declined (05/17/2023)   Received from [facility]   Hunger Vital Sign    Within the past 12 months, you worried that your food would run out before you got the money to buy more.: Patient declined    Within the past 12 months, the food you bought just didn't last and you didn't have money to get more.: Patient declined  Transportation Needs: Patient Declined (05/17/2023)   Received from [facility] - Transportation    In the past 12 months, has lack of transportation kept you from medical appointments or from getting medications?: Patient declined    Lack of Transportation (Non-Medical): Patient declined  Physical Activity: Not on file  Stress: Not on file  Social Connections: Not on file  Intimate Partner Violence: Not on file    Current Outpatient Medications on File Prior to Visit  Medication Sig Dispense Refill   ALPRAZolam (XANAX) 0.25 MG tablet Take by mouth at bedtime as needed. Reported on 10/01/2015     aspirin EC 81 MG tablet Take 81 mg by mouth daily.      atorvastatin (LIPITOR) 20 MG tablet Take 20 mg by mouth daily.      betamethasone  dipropionate 0.05 % cream Apply topically 2 (two) times daily as needed (Rash). 45 g 2   Calcium Carb-Cholecalciferol (OYSTER SHELL CALCIUM W/D) 500-5 MG-MCG TABS Take 1 tablet by mouth daily.     calcium-vitamin D (OSCAL WITH D) 500-200 MG-UNIT TABS tablet Take by mouth.     clobetasol  cream (TEMOVATE ) 0.05 % Apply 1 Application topically daily. Up to 5 days per week  as needed for flares, avoid face, groin and underarms 30 g 0   escitalopram (LEXAPRO) 10 MG tablet Take 10 mg by mouth. Take 1 1/2 tabs daily     glipiZIDE (GLUCOTROL XL) 10 MG 24 hr tablet Take 1 tablet by mouth daily.     Influenza vac split quadrivalent PF (FLUZONE HIGH-DOSE) 0.5 ML injection      ketoconazole  (NIZORAL ) 2 % cream APPLY TO SKIN 1 TO 2 TIMES DAILY 60 g 6   metFORMIN (GLUCOPHAGE-XR) 500 MG 24 hr tablet   0   mometasone  (ELOCON ) 0.1 % cream Apply to affected skin qd-bid prn 15 g 3   mometasone  (NASONEX ) 50 MCG/ACT nasal spray Place 2 sprays into the nose daily as needed.  ONE TOUCH ULTRA TEST test strip      Probiotic Product (ALIGN PO) Take 1 tablet by mouth daily.      Cysteamine Bitartrate (PROCYSBI) 300 MG PACK by XX route as directed (Patient not taking: Reported on 11/17/2021)     losartan-hydrochlorothiazide (HYZAAR) 50-12.5 MG tablet      No current facility-administered medications on file prior to visit.    No Known Allergies    Review of Systems ROS Review of Systems - General ROS: negative for - chills, fatigue, fever, hot flashes, night sweats, weight gain or weight loss Psychological ROS: negative for - anxiety, decreased libido, depression, mood swings, physical abuse or sexual abuse Ophthalmic ROS: negative for - blurry vision, eye pain or loss of vision ENT ROS: negative for - headaches, hearing change, visual changes or vocal changes Allergy and Immunology ROS: negative for - hives, itchy/watery eyes or seasonal allergies Hematological and Lymphatic ROS: negative for - bleeding problems, bruising, swollen lymph nodes or weight loss Endocrine ROS: negative for - galactorrhea, hair pattern changes, hot flashes, malaise/lethargy, mood swings, palpitations, polydipsia/polyuria, skin changes, temperature intolerance or unexpected weight changes Breast ROS: negative for - new or changing breast lumps or nipple discharge Respiratory ROS: negative for -  cough or shortness of breath Cardiovascular ROS: negative for - chest pain, irregular heartbeat, palpitations or shortness of breath Gastrointestinal ROS: no abdominal pain, change in bowel habits, or black or bloody stools Genito-Urinary ROS: no dysuria, trouble voiding, or hematuria Musculoskeletal ROS: negative for - joint pain or joint stiffness Neurological ROS: negative for - bowel and bladder control changes Dermatological ROS: negative for rash and skin lesion changes   She sees her chiro twice per week. Objective:   BP 127/78 (BP Location: Left Arm, Patient Position: Sitting, Cuff Size: Normal)   Pulse 62   Ht 5' (1.524 m)   Wt 173 lb 1.6 oz (78.5 kg)   BMI 33.81 kg/m  CONSTITUTIONAL: Well-developed, well-nourished female in no acute distress.  PSYCHIATRIC: Normal mood and affect. Normal behavior. Normal judgment and thought content. NEUROLGIC: Alert and oriented to person, place, and time. Normal muscle tone coordination. No cranial nerve deficit noted. HENT:  Normocephalic, atraumatic, External right and left ear normal. Oropharynx is clear and moist EYES: Conjunctivae and EOM are normal. Pupils are equal, round, and reactive to light. No scleral icterus.  NECK: Normal range of motion, supple, no masses.  Normal thyroid.  SKIN: Skin is warm and dry. No rash noted. Not diaphoretic. No erythema. No pallor. CARDIOVASCULAR: Normal heart rate noted, regular rhythm, no murmur. RESPIRATORY: Clear to auscultation bilaterally. Effort and breath sounds normal, no problems with respiration noted. BREASTS: Symmetric in size. No masses, skin changes, nipple drainage, or lymphadenopathy. ABDOMEN: Soft, normal bowel sounds, no distention noted.  No tenderness, rebound or guarding.  BLADDER: Normal EG- VVA but no abnormal lesions Pederson speculum exam reveals well supported vaginal cuff, moderate atrophy Bimanual exam reveals no masses or tenderness    Labs: Lab Results  Component  Value Date   WBC 10.1 09/02/2018   HGB 12.4 09/02/2018   HCT 36.8 09/02/2018   MCV 91.1 09/02/2018   PLT 281 09/02/2018    Lab Results  Component Value Date   CREATININE 1.24 (H) 09/02/2018   BUN 29 (H) 09/02/2018   NA 133 (L) 09/02/2018   K 3.7 09/02/2018   CL 96 (L) 09/02/2018   CO2 25 09/02/2018    Lab Results  Component Value Date   ALT 10  09/02/2018   AST 21 09/02/2018   ALKPHOS 83 09/02/2018   BILITOT 1.4 (H) 09/02/2018       Assessment:   Well woman exam  Plan:  Pap: Not needed VVA- estrace  generic cream 2 nights per week Mammogram: ordered Colon Screening:  UTD   Return to Clinic - 1 Year   Harland JAYSON Birkenhead, MD [facility]

## 2023-12-06 ENCOUNTER — Other Ambulatory Visit: Payer: Self-pay | Admitting: Dermatology

## 2023-12-06 DIAGNOSIS — L304 Erythema intertrigo: Secondary | ICD-10-CM

## 2023-12-19 ENCOUNTER — Ambulatory Visit: Admitting: Dermatology

## 2023-12-19 ENCOUNTER — Encounter: Payer: Self-pay | Admitting: Dermatology

## 2023-12-19 DIAGNOSIS — Z7189 Other specified counseling: Secondary | ICD-10-CM

## 2023-12-19 DIAGNOSIS — L578 Other skin changes due to chronic exposure to nonionizing radiation: Secondary | ICD-10-CM

## 2023-12-19 DIAGNOSIS — L814 Other melanin hyperpigmentation: Secondary | ICD-10-CM

## 2023-12-19 DIAGNOSIS — Z85828 Personal history of other malignant neoplasm of skin: Secondary | ICD-10-CM

## 2023-12-19 DIAGNOSIS — W908XXA Exposure to other nonionizing radiation, initial encounter: Secondary | ICD-10-CM

## 2023-12-19 DIAGNOSIS — L821 Other seborrheic keratosis: Secondary | ICD-10-CM

## 2023-12-19 DIAGNOSIS — D229 Melanocytic nevi, unspecified: Secondary | ICD-10-CM

## 2023-12-19 DIAGNOSIS — Z86006 Personal history of melanoma in-situ: Secondary | ICD-10-CM

## 2023-12-19 DIAGNOSIS — Z79899 Other long term (current) drug therapy: Secondary | ICD-10-CM

## 2023-12-19 DIAGNOSIS — L570 Actinic keratosis: Secondary | ICD-10-CM | POA: Diagnosis not present

## 2023-12-19 DIAGNOSIS — Z1283 Encounter for screening for malignant neoplasm of skin: Secondary | ICD-10-CM | POA: Diagnosis not present

## 2023-12-19 DIAGNOSIS — Z85820 Personal history of malignant melanoma of skin: Secondary | ICD-10-CM

## 2023-12-19 DIAGNOSIS — L603 Nail dystrophy: Secondary | ICD-10-CM

## 2023-12-19 DIAGNOSIS — D692 Other nonthrombocytopenic purpura: Secondary | ICD-10-CM

## 2023-12-19 DIAGNOSIS — Z86018 Personal history of other benign neoplasm: Secondary | ICD-10-CM

## 2023-12-19 NOTE — Patient Instructions (Addendum)
 Recommend applying OTC DermaNail conditioner to cuticle area of fingernails twice daily to help with chipping/breaking.  May take 3-6 months of regular use to get best result.  Could also add OTC Biotin supplement 2.5 mg daily to help strengthen nails.  Recommend mild soap with handwashing followed by a thick moisturizing cream to fingernails.  May use Cutemol cream,which comes with DermaNail, Neutrogena [place] hand cream, or Eucerin cream original.      Cryotherapy Aftercare  Wash gently with soap and water everyday.   Apply Vaseline and Band-Aid daily until healed.      Due to recent changes in healthcare laws, you may see results of your pathology and/or laboratory studies on MyChart before the doctors have had a chance to review them. We understand that in some cases there may be results that are confusing or concerning to you. Please understand that not all results are received at the same time and often the doctors may need to interpret multiple results in order to provide you with the best plan of care or course of treatment. Therefore, we ask that you please give us  2 business days to thoroughly review all your results before contacting the office for clarification. Should we see a critical lab result, you will be contacted sooner.   If You Need Anything After Your Visit  If you have any questions or concerns for your doctor, please call our main line at [phone] and press option 4 to reach your doctor's medical assistant. If no one answers, please leave a voicemail as directed and we will return your call as soon as possible. Messages left after 4 pm will be answered the following business day.   You may also send us  a message via MyChart. We typically respond to MyChart messages within 1-2 business days.  For prescription refills, please ask your pharmacy to contact our office. Our fax number is [phone].  If you have an urgent issue when the clinic is closed that cannot  wait until the next business day, you can page your doctor at the number below.    Please note that while we do our best to be available for urgent issues outside of office hours, we are not available 24/7.   If you have an urgent issue and are unable to reach us , you may choose to seek medical care at your doctor's office, retail clinic, urgent care center, or emergency room.  If you have a medical emergency, please immediately call 911 or go to the emergency department.  Pager Numbers  - Dr. Hester: [phone]  - Dr. Jackquline: [phone]  - Dr. Claudene: [phone]   - Dr. Raymund: [phone]  In the event of inclement weather, please call our main line at [phone] for an update on the status of any delays or closures.  Dermatology Medication Tips: Please keep the boxes that topical medications come in in order to help keep track of the instructions about where and how to use these. Pharmacies typically print the medication instructions only on the boxes and not directly on the medication tubes.   If your medication is too expensive, please contact our office at [phone] option 4 or send us  a message through MyChart.   We are unable to tell what your co-pay for medications will be in advance as this is different depending on your insurance coverage. However, we may be able to find a substitute medication at lower cost or fill out paperwork to get insurance to cover a needed medication.  If a prior authorization is required to get your medication covered by your insurance company, please allow us  1-2 business days to complete this process.  Drug prices often vary depending on where the prescription is filled and some pharmacies may offer cheaper prices.  The website www.goodrx.com contains coupons for medications through different pharmacies. The prices here do not account for what the cost may be with help from insurance (it may be cheaper with your insurance), but the  website can give you the price if you did not use any insurance.  - You can print the associated coupon and take it with your prescription to the pharmacy.  - You may also stop by our office during regular business hours and pick up a GoodRx coupon card.  - If you need your prescription sent electronically to a different pharmacy, notify our office through [facility] or by phone at [phone] option 4.     Si Usted Necesita Algo Despus de Su Visita  Tambin puede enviarnos un mensaje a travs de Clinical cytogeneticist. Por lo general respondemos a los mensajes de MyChart en el transcurso de 1 a 2 das hbiles.  Para renovar recetas, por favor pida a su farmacia que se ponga en contacto con nuestra oficina. Randi lakes de fax es [place] [phone].  Si tiene un asunto urgente cuando la clnica est cerrada y que no puede esperar hasta el siguiente da hbil, puede llamar/localizar a su doctor(a) al nmero que aparece a continuacin.   Por favor, tenga en cuenta que aunque hacemos todo lo posible para estar disponibles para asuntos urgentes fuera del horario de [place], no estamos disponibles las 24 horas del da, los 7 [address]  [address] de la [place].   Si tiene un problema urgente y no puede comunicarse con nosotros, puede optar por buscar atencin mdica  en el consultorio de su doctor(a), en una clnica privada, en un centro de atencin urgente o en una sala de emergencias.  Si tiene Engineer, drilling, por favor llame inmediatamente al 911 o vaya a la sala de emergencias.  Nmeros de bper  - Dr. Hester: [phone]  - Dra. Jackquline: [phone]  - Dr. Claudene: [phone]  - Dra. Kitts: [phone]  En caso de inclemencias del [place], por favor llame a nuestra lnea principal al [phone] para una actualizacin sobre el estado de cualquier retraso o cierre.  Consejos para la medicacin en dermatologa: Por favor, guarde las cajas en las que vienen los medicamentos de uso tpico para  ayudarle a seguir las instrucciones sobre dnde y cmo usarlos. Las farmacias generalmente imprimen las instrucciones del medicamento slo en las cajas y no directamente en los tubos del [place].   Si su medicamento es muy caro, por favor, pngase en contacto con landry rieger llamando al [phone] y presione la opcin 4 o envenos un mensaje a travs de Clinical cytogeneticist.   No podemos decirle cul ser su copago por los medicamentos por adelantado ya que esto es diferente dependiendo de la cobertura de su seguro. Sin embargo, es posible que podamos encontrar un medicamento sustituto a Audiological scientist un formulario para que el seguro cubra el medicamento que se considera necesario.   Si se requiere una autorizacin previa para que su compaa de seguros malta su medicamento, por favor permtanos de 1 a 2 das hbiles para completar este proceso.  Los precios de los medicamentos varan con frecuencia dependiendo del Environmental consultant de dnde se surte la receta y alguna farmacias pueden ofrecer precios ms baratos.  El sitio web www.goodrx.com tiene cupones para medicamentos de Health and safety inspector. Los precios aqu no tienen en cuenta lo que podra costar con la ayuda del seguro (puede ser ms barato con su seguro), pero el sitio web puede darle el precio si no utiliz Tourist information centre manager.  - Puede imprimir el cupn correspondiente y llevarlo con su receta a la farmacia.  - Tambin puede pasar por nuestra oficina durante el horario de atencin regular y Education officer, museum una tarjeta de cupones de GoodRx.  - Si necesita que su receta se enve electrnicamente a una farmacia diferente, informe a nuestra oficina a travs de MyChart de  o por telfono llamando al [phone] y presione la opcin 4.

## 2023-12-19 NOTE — Progress Notes (Signed)
 Follow-Up Visit   Subjective  Valerie Farmer is a 73 y.o. female who presents for the following: Skin Cancer Screening and Full Body Skin Exam  The patient presents for Total-Body Skin Exam (TBSE) for skin cancer screening and mole check. The patient has spots, moles and lesions to be evaluated, some may be new or changing and the patient may have concern these could be cancer.  The following portions of the chart were reviewed this encounter and updated as appropriate: medications, allergies, medical history  Review of Systems:  No other skin or systemic complaints except as noted in HPI or Assessment and Plan.  Objective  Well appearing patient in no apparent distress; mood and affect are within normal limits.  A full examination was performed including scalp, head, eyes, ears, nose, lips, neck, chest, axillae, abdomen, back, buttocks, bilateral upper extremities, bilateral lower extremities, hands, feet, fingers, toes, fingernails, and toenails. All findings within normal limits unless otherwise noted below.   Relevant physical exam findings are noted in the Assessment and Plan.  face x 4 (4) Erythematous thin papules/macules with gritty scale.   Assessment & Plan   SKIN CANCER SCREENING PERFORMED TODAY.  LENTIGINES, SEBORRHEIC KERATOSES, HEMANGIOMAS - Benign normal skin lesions - Benign-appearing - Call for any changes  MELANOCYTIC NEVI - Tan-brown and/or pink-flesh-colored symmetric macules and papules - Benign appearing on exam today - Observation - Call clinic for new or changing moles - Recommend daily use of broad spectrum spf 30+ sunscreen to sun-exposed areas.   Purpura - Chronic; persistent and recurrent.  Treatable, but not curable. - Violaceous macules and patches - Benign - Related to trauma, age, sun damage and/or use of blood thinners, chronic use of topical and/or oral steroids - Observe - Can use OTC arnica containing moisturizer such as Dermend  Bruise Formula if desired - Call for worsening or other concerns   NAIL PROBLEM Exam: Ridged nails    Treatment Plan: Recommend applying OTC DermaNail conditioner to cuticle area of fingernails twice daily to help with chipping/breaking.  May take 3-6 months of regular use to get best result.  Could also add OTC Biotin supplement 2.5 mg daily to help strengthen nails.  Recommend mild soap with handwashing followed by a thick moisturizing cream to fingernails.  May use Cutemol cream,which comes with DermaNail, Neutrogena [place] hand cream, or Eucerin cream original.    HISTORY OF DYSPLASTIC NEVUS No evidence of recurrence today Recommend regular full body skin exams Recommend daily broad spectrum sunscreen SPF 30+ to sun-exposed areas, reapply every 2 hours as needed.  Call if any new or changing lesions are noted between office visits   HISTORY OF BASAL CELL CARCINOMA OF THE SKIN - No evidence of recurrence today - Recommend regular full body skin exams - Recommend daily broad spectrum sunscreen SPF 30+ to sun-exposed areas, reapply every 2 hours as needed.  - Call if any new or changing lesions are noted between office visits   HISTORY OF MELANOMA IN SITU Left inferior scapula 2016\ - No lymphadenopathy  - No evidence of recurrence today - Recommend regular full body skin exams - Recommend daily broad spectrum sunscreen SPF 30+ to sun-exposed areas, reapply every 2 hours as needed.  - Call if any new or changing lesions are noted between office visits   AK (ACTINIC KERATOSIS) (4) face x 4 (4) ACTINIC DAMAGE - chronic, secondary to cumulative UV radiation exposure/sun exposure over time - diffuse scaly erythematous macules with underlying dyspigmentation - Recommend  daily broad spectrum sunscreen SPF 30+ to sun-exposed areas, reapply every 2 hours as needed.  - Recommend staying in the shade or wearing long sleeves, sun glasses (UVA+UVB protection) and wide brim hats (4-inch brim  around the entire circumference of the hat). - Call for new or changing lesions.  Destruction of lesion - face x 4 (4) Complexity: simple   Destruction method: cryotherapy   Informed consent: discussed and consent obtained   Timeout:  patient name, date of birth, surgical site, and procedure verified Lesion destroyed using liquid nitrogen: Yes   Region frozen until ice ball extended beyond lesion: Yes   Outcome: patient tolerated procedure well with no complications   Post-procedure details: wound care instructions given     Return in about 1 year (around 12/18/2024) for TBSE, hx of MMis, hx of BCC, hx of Dysplastic nevus .  IFay Kirks, CMA, am acting as scribe for Alm Rhyme, MD .   Documentation: I have reviewed the above documentation for accuracy and completeness, and I agree with the above.  Alm Rhyme, MD

## 2023-12-28 ENCOUNTER — Ambulatory Visit
Admission: RE | Admit: 2023-12-28 | Discharge: 2023-12-28 | Disposition: A | Discharge status: Home / Self Care | Source: Ambulatory Visit | Attending: Obstetrics & Gynecology | Admitting: Obstetrics & Gynecology

## 2023-12-28 DIAGNOSIS — Z1231 Encounter for screening mammogram for malignant neoplasm of breast: Secondary | ICD-10-CM | POA: Diagnosis present

## 2024-03-26 ENCOUNTER — Encounter: Payer: Self-pay | Admitting: Podiatry

## 2024-03-26 ENCOUNTER — Ambulatory Visit: Admitting: Podiatry

## 2024-03-26 VITALS — Ht 60.0 in | Wt 173.1 lb

## 2024-03-26 DIAGNOSIS — M79674 Pain in right toe(s): Secondary | ICD-10-CM

## 2024-03-26 DIAGNOSIS — L84 Corns and callosities: Secondary | ICD-10-CM

## 2024-03-26 DIAGNOSIS — E119 Type 2 diabetes mellitus without complications: Secondary | ICD-10-CM | POA: Diagnosis not present

## 2024-03-26 DIAGNOSIS — M79675 Pain in left toe(s): Secondary | ICD-10-CM | POA: Diagnosis not present

## 2024-03-26 DIAGNOSIS — B351 Tinea unguium: Secondary | ICD-10-CM | POA: Diagnosis not present

## 2024-03-26 NOTE — Progress Notes (Signed)
 This patient returns to my office for at risk foot care.  This patient requires this care by a professional since this patient will be at risk due to having  Diabetes.  She presents to the office for preventative foot care services.  General Appearance  Alert, conversant and in no acute stress.  Vascular  Dorsalis pedis and posterior tibial  pulses are palpable  bilaterally.  Capillary return is within normal limits  bilaterally. Temperature is within normal limits  bilaterally.  Neurologic  Senn-Weinstein monofilament wire test diminished   bilaterally. Muscle power within normal limits bilaterally.  Nails Thick disfigured hallux nails  B/L asymptomatic.   No evidence of bacterial infection or drainage bilaterally.  Pincer right hallux.  Orthopedic  No limitations of motion  feet .  No crepitus or effusions noted.  No bony pathology or digital deformities noted.  Plantar flexed  third and fourth metatarsal  B/L.  Skin  normotropic skin with  Porokeratosis sub 4th  left  and sub 3rd right..   noted bilaterally.  No signs of infections or ulcers noted.     Onychomycosis  Pain in right toes  Pain in left toes  Callus forefoot  B/L.  Consent was obtained for treatment procedures.   Mechanical debridement of nails 1-5  bilaterally performed with a nail nipper.   Debride callus with # 15 blade and dremel tool.  Cordella Bold DPM    Return office visit     4   months                   Cordella Bold [facility]

## 2024-07-18 ENCOUNTER — Ambulatory Visit: Admitting: Podiatry

## 2024-07-31 ENCOUNTER — Ambulatory Visit: Admitting: Podiatry

## 2024-09-24 ENCOUNTER — Ambulatory Visit: Admitting: Podiatry

## 2024-12-24 ENCOUNTER — Ambulatory Visit: Admitting: Dermatology
